# Patient Record
Sex: Female | Born: 1980 | Race: Black or African American | Hispanic: No | Marital: Married | Smoking: Never smoker
Health system: Southern US, Community
[De-identification: ages and names within clinical notes are randomized; demographics above are authoritative.]

## PROBLEM LIST (undated history)

## (undated) HISTORY — PX: BREAST EXCISIONAL BIOPSY: SUR124

---

## 2017-01-09 ENCOUNTER — Emergency Department (HOSPITAL_COMMUNITY)
Admission: EM | Admit: 2017-01-09 | Discharge: 2017-01-09 | Disposition: A | Discharge status: Home / Self Care | Payer: Self-pay | Attending: Emergency Medicine | Admitting: Emergency Medicine

## 2017-01-09 ENCOUNTER — Encounter (HOSPITAL_COMMUNITY): Payer: Self-pay

## 2017-01-09 DIAGNOSIS — H1133 Conjunctival hemorrhage, bilateral: Secondary | ICD-10-CM | POA: Insufficient documentation

## 2017-01-09 NOTE — ED Notes (Signed)
Patient was alert, oriented and stable upon discharge. RN went over AVS and patient had no further questions.  

## 2017-01-09 NOTE — ED Triage Notes (Signed)
Pt states that she vomited 2 days ago. No vomiting or trouble since. Since then her L eye has been red. It appears that a blood vessel has popped. She denies any vision changes or difficulty, but states that her eye is sore. A&Ox4. No N/V/D.

## 2017-01-09 NOTE — ED Provider Notes (Signed)
[facility]-EMERGENCY DEPT Provider Note   CSN: [id number] Arrival date & time: 01/09/17  1808  By signing my name below, I, Alyssa Grove, attest that this documentation has been prepared under the direction and in the presence of [address], [place]. Electronically Signed: Alyssa Grove, ED Scribe. 01/09/17. 8:09 PM.  History   Chief Complaint Chief Complaint  Patient presents with  . Eye Problem   The history is provided by the patient. No language interpreter was used.  Eye Problem   This is a new problem. The current episode started more than 2 days ago. The problem occurs constantly. The problem has not changed since onset.There is a problem in both eyes. There was no injury mechanism. The pain is at a severity of 8/10. The pain is moderate. There is no history of trauma to the eye. There is no known exposure to pink eye. She does not wear contacts. Associated symptoms include eye redness. Pertinent negatives include no numbness, no blurred vision, no decreased vision, no discharge, no double vision, no photophobia, no nausea, no vomiting, no tingling, no weakness and no itching. She has tried nothing for the symptoms. The treatment provided no relief.   HPI Comments: Sarah Sanchez is a 36 y.o. female who presents to the Emergency Department complaining of bilateral eye pain for 4 days. She states she began vomiting after eating out 5 days ago. She awoke the next morning with eye redness and pain. Pt describes eye pain as 8/10, constant, unchanged, non-radiating, L eye soreness that is exacerbated with extended computer usage, improved with using sunglasses. States that the R eye doesn't hurt. She reports associated eye redness. No other treatments tried. She is unaware of any recent contact with similar symptoms. Pt does not wear contact lenses. No ongoing vomiting since she ate out at the restaurant 5 days ago and vomited once. Denies blurry vision, visual deficits, eye drainage, foreign bodies into  eye, eye itchiness, fevers, chills, CP, SOB, abd pain, N/V/D/C, hematuria, dysuria, myalgias, arthralgias, numbness, tingling, focal weakness, or any other complaints at this time.   History reviewed. No pertinent past medical history.  There are no active problems to display for this patient.   No past surgical history on file.  OB History    No data available       Home Medications    Prior to Admission medications   Not on File    Family History History reviewed. No pertinent family history.  Social History Social History  Substance Use Topics  . Smoking status: Not on file  . Smokeless tobacco: Not on file  . Alcohol use Not on file     Allergies   Patient has no known allergies.   Review of Systems Review of Systems  Constitutional: Negative for chills and fever.  Eyes: Positive for pain and redness. Negative for blurred vision, double vision, photophobia, discharge, itching and visual disturbance.  Respiratory: Negative for shortness of breath.   Cardiovascular: Negative for chest pain.  Gastrointestinal: Negative for abdominal pain, constipation, diarrhea, nausea and vomiting.  Genitourinary: Negative for dysuria and hematuria.  Musculoskeletal: Negative for arthralgias and myalgias.  Skin: Negative for color change and itching.  Allergic/Immunologic: Negative for immunocompromised state.  Neurological: Negative for tingling, weakness and numbness.  Psychiatric/Behavioral: Negative for confusion.  10 Systems reviewed and are negative for acute change except as noted in the HPI.   Physical Exam Updated Vital Signs BP (!) 152/131 (BP Location: Left Arm)   Pulse 78  Temp 98.4 F (36.9 C) (Oral)   Resp 12   Ht 5\' 7"  (1.702 m)   Wt 207 lb 4 oz (94 kg)   SpO2 100%   BMI 32.46 kg/m   Physical Exam  Constitutional: She is oriented to person, place, and time. Vital signs are normal. She appears well-developed and well-nourished.  Non-toxic appearance.  No distress.  Afebrile, nontoxic, NAD  HENT:  Head: Normocephalic and atraumatic.  Mouth/Throat: Mucous membranes are normal.  Eyes: EOM are normal. Pupils are equal, round, and reactive to light. Right eye exhibits no discharge. Left eye exhibits no discharge. Right conjunctiva has a hemorrhage. Left conjunctiva has a hemorrhage.  PERRL, EOMI, no nystagmus, no visual field deficits Left eye with small subconjunctival hemorrhage to the medial aspect, right eye with a trace punctate subconjunctival hemorrhage to the lateral aspect, no ocular drainage or FB's noted  Neck: Normal range of motion. Neck supple.  Cardiovascular: Normal rate and intact distal pulses.   Pulmonary/Chest: Effort normal. No respiratory distress.  Abdominal: Normal appearance. She exhibits no distension.  Musculoskeletal: Normal range of motion.  Neurological: She is alert and oriented to person, place, and time. She has normal strength. No sensory deficit.  Skin: Skin is warm, dry and intact. No rash noted.  Psychiatric: She has a normal mood and affect. Her behavior is normal.  Nursing note and vitals reviewed.  ED Treatments / Results  DIAGNOSTIC STUDIES: Oxygen Saturation is 100% on RA, normal by my interpretation.    Labs (all labs ordered are listed, but only abnormal results are displayed) Labs Reviewed - No data to display  EKG  EKG Interpretation None       Radiology No results found.  Procedures Procedures (including critical care time)  Medications Ordered in ED Medications - No data to display   Initial Impression / Assessment and Plan / ED Course  I have reviewed the triage vital signs and the nursing notes.  Pertinent labs & imaging results that were available during my care of the patient were reviewed by me and considered in my medical decision making (see chart for details).     36 y.o. female here with eye soreness after she vomited once and caused a small subconjunctival  hemorrhage to her L eye, and a trace punctate subconjunctival hemorrhage of the right eye. No visual changes, PERRL, EOMI, no drainage, no FBs. Doubt glaucoma, iritis, abrasion, or other concerning emergent etiology. Discussed that this should reabsorb on its own over the next several weeks. Advised pt to use tylenol/motrin/warm or cool compresses, and f/up with ophthalmology in 3-4 days for recheck. I explained the diagnosis and have given explicit precautions to return to the ER including for any other new or worsening symptoms. The patient understands and accepts the medical plan as it's been dictated and I have answered their questions. Discharge instructions concerning home care and prescriptions have been given. The patient is STABLE and is discharged to home in good condition.   I personally performed the services described in this documentation, which was scribed in my presence. The recorded information has been reviewed and is accurate.   Final Clinical Impressions(s) / ED Diagnoses   Final diagnoses:  Subconjunctival hemorrhage of both eyes    New Prescriptions New Prescriptions   No medications on file     [address]Torria Fromer, PA-C 01/09/17 2016    Nira ConnPedro Eduardo Cardama, MD 01/10/17 [phone]

## 2017-01-09 NOTE — Discharge Instructions (Signed)
You have a subconjunctival hemorrhage, which is like a bruise in your eye. The blood will reabsorb on its own over the next several weeks. May consider using tylenol or motrin as needed for pain, and a warm or cool compress to your eyes to help with pain. Follow up with the ophthalmologist in the next 3-4 days for recheck of symptoms and ongoing management of your symptoms. Return to the ER for emergent changes or worsening symptoms.

## 2017-12-23 ENCOUNTER — Encounter (HOSPITAL_COMMUNITY): Payer: Self-pay | Admitting: *Deleted

## 2017-12-23 ENCOUNTER — Emergency Department (HOSPITAL_COMMUNITY)
Admission: EM | Admit: 2017-12-23 | Discharge: 2017-12-23 | Disposition: A | Discharge status: Home / Self Care | Payer: Self-pay | Attending: Emergency Medicine | Admitting: Emergency Medicine

## 2017-12-23 ENCOUNTER — Other Ambulatory Visit: Payer: Self-pay

## 2017-12-23 DIAGNOSIS — M25561 Pain in right knee: Secondary | ICD-10-CM

## 2017-12-23 DIAGNOSIS — G8929 Other chronic pain: Secondary | ICD-10-CM | POA: Insufficient documentation

## 2017-12-23 DIAGNOSIS — M25562 Pain in left knee: Secondary | ICD-10-CM | POA: Insufficient documentation

## 2017-12-23 DIAGNOSIS — Z3202 Encounter for pregnancy test, result negative: Secondary | ICD-10-CM | POA: Insufficient documentation

## 2017-12-23 LAB — POC URINE PREG, ED: Preg Test, Ur: NEGATIVE

## 2017-12-23 NOTE — ED Notes (Signed)
Declined W/C at D/C and was escorted to lobby by RN. 

## 2017-12-23 NOTE — ED Provider Notes (Signed)
[facility]ENC[facility] Provider Note   CSN: [id number] Arrival date & time: 12/23/17  1156     History   Chief Complaint Chief Complaint  Patient presents with  . Knee Pain  . Possible Pregnancy    HPI Sarah Sanchez is a 37 y.o. female.  The history is provided by the patient and medical records. No language interpreter was used.   Sarah Sanchez is an otherwise healthy 37 year old female who presents to emergency department for bilateral knee pain x 2 years.  Pain is intermittent and often occurs after working on her feet all day.  She has been taking Tylenol or Motrin as needed which is been helpful.  Denies any associated swelling, numbness, tingling or weakness.  Never sought care for this in the past.  Patient states that her knees are bothering her yesterday, therefore she called out of work.  Her employer told her today that she would need work note stating that she could return, therefore she came to the emergency department.  Patient additionally got Nexplanon birth control removed last week and concerned for possible pregnancy. She did not know how long after removal of this that she needed to use protection. She is having no abdominal pain, nausea, vomiting, dysuria, vaginal discharge or bleeding.   History reviewed. No pertinent past medical history.  There are no active problems to display for this patient.   History reviewed. No pertinent surgical history.  OB History    No data available       Home Medications    Prior to Admission medications   Not on File    Family History No family history on file.  Social History Social History   Tobacco Use  . Smoking status: Not on file  Substance Use Topics  . Alcohol use: Not on file  . Drug use: Not on file     Allergies   Patient has no known allergies.   Review of Systems Review of Systems  Gastrointestinal: Negative for abdominal pain, nausea and vomiting.    Genitourinary: Negative for difficulty urinating, dysuria, frequency, urgency, vaginal bleeding and vaginal discharge.  Musculoskeletal: Positive for arthralgias. Negative for joint swelling.  Neurological: Negative for weakness and numbness.     Physical Exam Updated Vital Signs BP 132/79 (BP Location: Right Arm)   Pulse 74   Temp 98.6 F (37 C) (Oral)   Resp 16   LMP 11/12/2017 (Approximate)   SpO2 97%   Physical Exam  Constitutional: She appears well-developed and well-nourished. No distress.  HENT:  Head: Normocephalic and atraumatic.  Neck: Neck supple.  Cardiovascular: Normal rate, regular rhythm and normal heart sounds.  No murmur heard. Pulmonary/Chest: Effort normal and breath sounds normal. No respiratory distress. She has no wheezes. She has no rales.  Musculoskeletal: Normal range of motion.  Bilateral knees with full range of motion.  5/5 muscle strength to lower extremities.  Ligaments intact.  No tenderness.  2+ DP.  Sensation intact.  Neurological: She is alert.  Skin: Skin is warm and dry.  Nursing note and vitals reviewed.    ED Treatments / Results  Labs (all labs ordered are listed, but only abnormal results are displayed) Labs Reviewed  POC URINE PREG, ED    EKG  EKG Interpretation None       Radiology No results found.  Procedures Procedures (including critical care time)  Medications Ordered in ED Medications - No data to display   Initial Impression / Assessment and  Plan / ED Course  I have reviewed the triage vital signs and the nursing notes.  Pertinent labs & imaging results that were available during my care of the patient were reviewed by me and considered in my medical decision making (see chart for details).    [name] is a 37 y.o. female who presents to ED for bilateral knee pain for 2 years.  Neurovascularly intact on exam.  Exam quite benign with no tenderness, full strength and ligaments intact.  Patient  ambulatory in ED without any difficulty.  She states that she missed work because of her knee pain yesterday and needs a note to return.  Offered x-rays for further evaluation of knee pain, but she states that she really just needs a note to go back to work.  I feel this is appropriate.  We will have her follow-up with PCP or Ortho as an outpatient as needed.  She also is requesting pregnancy test while in ED.  Pregnancy test negative.  Evaluation does not show pathology that would require ongoing emergent intervention or inpatient treatment. All questions answered.   Final Clinical Impressions(s) / ED Diagnoses   Final diagnoses:  Chronic pain of both knees  Negative pregnancy test    ED Discharge Orders    None       Mieshia Pepitone, Chase Picket, PA-C 12/23/17 1332    Eber Hong, MD 12/24/17 [phone]

## 2017-12-23 NOTE — Discharge Instructions (Signed)
It was my pleasure taking care of you today!   Tylenol or ibuprofen as needed for pain. Ice can also be helpful to aide in pain relief.   Please call either the primary care clinic or the orthopedic doctor listed to schedule a follow up appointment.   Return to the ER for new or worsening symptoms, any additional concerns.

## 2017-12-23 NOTE — ED Triage Notes (Signed)
PT needs work note for work. And has Bil. Knee pain

## 2017-12-23 NOTE — ED Triage Notes (Signed)
To ED for eval of bilateral knee pain and lower back pain. States this pain has been for months. States she called out of work Thursday and told she can't come back until she has a note. Ambulatory without difficulty. Also requests pregnancy test

## 2018-02-25 ENCOUNTER — Emergency Department (HOSPITAL_COMMUNITY)
Admission: EM | Admit: 2018-02-25 | Discharge: 2018-02-26 | Disposition: A | Discharge status: Home / Self Care | Payer: Self-pay | Attending: Emergency Medicine | Admitting: Emergency Medicine

## 2018-02-25 ENCOUNTER — Encounter (HOSPITAL_COMMUNITY): Payer: Self-pay

## 2018-02-25 ENCOUNTER — Emergency Department (HOSPITAL_COMMUNITY): Payer: Self-pay

## 2018-02-25 ENCOUNTER — Other Ambulatory Visit: Payer: Self-pay

## 2018-02-25 DIAGNOSIS — G8929 Other chronic pain: Secondary | ICD-10-CM

## 2018-02-25 DIAGNOSIS — M25562 Pain in left knee: Secondary | ICD-10-CM | POA: Insufficient documentation

## 2018-02-25 DIAGNOSIS — M25561 Pain in right knee: Secondary | ICD-10-CM | POA: Insufficient documentation

## 2018-02-25 NOTE — ED Provider Notes (Signed)
[facility]   CSN: [id number] Arrival date & time: 02/25/18  2039     History   Chief Complaint Chief Complaint  Patient presents with  . Knee Pain    bilateral    HPI Sarah Beechsha Burgener is a 37 y.o. female.   37 year old female presents to the emergency department for evaluation of bilateral knee pain.  She reports onset of knee pain shortly after starting her job at [employer].  She states that she is on her feet frequently and her knee hurts more when she is standing for prolonged periods.  She has taken ibuprofen for symptoms with mild, temporary relief.  She denies any history of trauma or injury.  She feels as though the area is swollen at times.  Her right knee often hurts more frequently than her left.  No associated numbness or paresthesias.  No medications taken prior to arrival today.    History reviewed. No pertinent past medical history.  There are no active problems to display for this patient.   History reviewed. No pertinent surgical history.   OB History   None      Home Medications    Prior to Admission medications   Medication Sig Start Date End Date Taking? Authorizing Provider  diclofenac (VOLTAREN) 75 MG EC tablet Take 1 tablet (75 mg total) by mouth 2 (two) times daily. 02/26/18   Antony MaduraHumes, Jaimes Eckert, PA-C    Family History No family history on file.  Social History Social History   Tobacco Use  . Smoking status: Never Smoker  . Smokeless tobacco: Never Used  Substance Use Topics  . Alcohol use: Never    Frequency: Never  . Drug use: Never     Allergies   Patient has no known allergies.   Review of Systems Review of Systems Ten systems reviewed and are negative for acute change, except as noted in the HPI.    Physical Exam Updated Vital Signs BP 125/74 (BP Location: Right Arm)   Pulse 72   Temp 98.4 F (36.9 C) (Oral)   Resp 16   Ht 5\' 7"  (1.702 m)   Wt 86.2 kg (190 lb)   LMP  02/12/2018 (Approximate)   SpO2 100%   BMI 29.76 kg/m   Physical Exam  Constitutional: She is oriented to person, place, and time. She appears well-developed and well-nourished. No distress.  Nontoxic appearing and in no distress  HENT:  Head: Normocephalic and atraumatic.  Eyes: Conjunctivae and EOM are normal. No scleral icterus.  Neck: Normal range of motion.  Cardiovascular: Normal rate, regular rhythm and intact distal pulses.  DP pulse 2+ bilaterally  Pulmonary/Chest: Effort normal. No stridor. No respiratory distress.  Respirations even and unlabored  Musculoskeletal: Normal range of motion. She exhibits tenderness.       Right knee: She exhibits normal range of motion, no swelling, no deformity, no erythema, no LCL laxity and no MCL laxity. Tenderness found.       Left knee: Normal.  Mild diffuse TTP to the right knee.  Neurological: She is alert and oriented to person, place, and time. She exhibits normal muscle tone. Coordination normal.  Skin: Skin is warm and dry. No rash noted. She is not diaphoretic. No erythema. No pallor.  Psychiatric: She has a normal mood and affect. Her behavior is normal.  Nursing note and vitals reviewed.    ED Treatments / Results  Labs (all labs ordered are listed, but only abnormal results are  displayed) Labs Reviewed - No data to display  EKG None  Radiology Dg Knee Complete 4 Views Right  Result Date: 02/25/2018 CLINICAL DATA:  Anterior right knee pain for several months. EXAM: RIGHT KNEE - COMPLETE 4+ VIEW COMPARISON:  None. FINDINGS: No evidence of fracture, dislocation, or joint effusion. No evidence of arthropathy or other focal bone abnormality. Soft tissues are unremarkable. IMPRESSION: Negative. Electronically Signed   By: Sherian Rein M.D.   On: 02/25/2018 23:55    Procedures Procedures (including critical care time)  Medications Ordered in ED Medications - No data to display   11:53 PM Patient expresses desire for  x-rays.  Will order x-ray of the right knee.  Have discussed low suspicion for bony deformity or fracture, especially in light of chronicity with no history of trauma.  Patient verbalizes understanding.  As her symptoms have been worse with prolonged periods of standing, especially while at her job, have recommended the use of orthotics.  Plan for referral to sports medicine at discharge.   Initial Impression / Assessment and Plan / ED Course  I have reviewed the triage vital signs and the nursing notes.  Pertinent labs & imaging results that were available during my care of the patient were reviewed by me and considered in my medical decision making (see chart for details).     Patient presents to the emergency department for evaluation of bilateral knee pain R>L. Seen in February for same. Patient neurovascularly intact on exam. Imaging negative for fracture, dislocation, bony deformity. Plan for supportive management including RICE and NSAIDs; primary care follow up as needed. Return precautions discussed and provided. Patient discharged in stable condition with no unaddressed concerns.   Final Clinical Impressions(s) / ED Diagnoses   Final diagnoses:  Chronic pain of right knee    ED Discharge Orders        Ordered    diclofenac (VOLTAREN) 75 MG EC tablet  2 times daily     02/26/18 0007       Antony Madura, PA-C 02/26/18 0009    Nira Conn, MD 02/26/18 1351

## 2018-02-25 NOTE — ED Triage Notes (Addendum)
Patient c/o bilateral knee pain that has been worsening over time (2 months). Patient is tearful; had not had xray of knees. Denies injury.

## 2018-02-26 MED ORDER — DICLOFENAC SODIUM 75 MG PO TBEC
75.0000 mg | DELAYED_RELEASE_TABLET | Freq: Two times a day (BID) | ORAL | 0 refills | Status: DC
Start: 2018-02-26 — End: 2022-09-15

## 2018-02-26 NOTE — Discharge Instructions (Addendum)
We recommend the use of orthotics for more equal distribution of your weight, especially while you are working given the you are on your feet for long periods of time.  We have prescribed diclofenac for you to take twice a day as needed for pain.  Ice to areas of pain to limit swelling.  You may use a knee sleeve for stability if desired.

## 2020-09-16 ENCOUNTER — Emergency Department (HOSPITAL_COMMUNITY)
Admission: EM | Admit: 2020-09-16 | Discharge: 2020-09-16 | Disposition: A | Discharge status: Home / Self Care | Payer: Medicaid Other | Attending: Emergency Medicine | Admitting: Emergency Medicine

## 2020-09-16 ENCOUNTER — Emergency Department (HOSPITAL_COMMUNITY): Payer: Medicaid Other

## 2020-09-16 ENCOUNTER — Encounter (HOSPITAL_COMMUNITY): Payer: Self-pay

## 2020-09-16 ENCOUNTER — Other Ambulatory Visit: Payer: Self-pay

## 2020-09-16 DIAGNOSIS — S8001XA Contusion of right knee, initial encounter: Secondary | ICD-10-CM | POA: Diagnosis not present

## 2020-09-16 DIAGNOSIS — W01198A Fall on same level from slipping, tripping and stumbling with subsequent striking against other object, initial encounter: Secondary | ICD-10-CM | POA: Insufficient documentation

## 2020-09-16 DIAGNOSIS — M25561 Pain in right knee: Secondary | ICD-10-CM | POA: Diagnosis present

## 2020-09-16 NOTE — ED Triage Notes (Signed)
Pt arrived via walk in, c/o right knee pain and feeling unsteady when ambulating on it. States he was mopping last night, slipped and hit knee off kitchen table. Pain worsening this morning. Ambulatory but painful. No visible deformity.

## 2020-09-16 NOTE — ED Provider Notes (Signed)
[facility]-EMERGENCY DEPT Provider Note   CSN: [id number] Arrival date & time: 09/16/20  1008     History Chief Complaint  Patient presents with  . Knee Pain    Sarah Sanchez is a 39 y.o. female.  HPI Patient presents with right knee pain.  States she was mopping last night slipped and twisted her right knee and hit it on the table.  Some pain at the time but now worse.  Difficulty walking on this.  States it feels like it could give out now.  Pain is more on the front of the knee.  No other injury.  No numbness or weakness.  Has had mild aches of the knee at times but otherwise has not been an issue for her.    History reviewed. No pertinent past medical history.  There are no problems to display for this patient.   History reviewed. No pertinent surgical history.   OB History   No obstetric history on file.     History reviewed. No pertinent family history.  Social History   Tobacco Use  . Smoking status: Never Smoker  . Smokeless tobacco: Never Used  Substance Use Topics  . Alcohol use: Never  . Drug use: Never    Home Medications Prior to Admission medications   Medication Sig Start Date End Date Taking? Authorizing Provider  diclofenac (VOLTAREN) 75 MG EC tablet Take 1 tablet (75 mg total) by mouth 2 (two) times daily. 02/26/18   Antony Madura, PA-C    Allergies    Patient has no known allergies.  Review of Systems   Review of Systems  Musculoskeletal:       Right knee pain and injury.  Neurological: Negative for weakness and numbness.  Psychiatric/Behavioral: Negative for confusion.    Physical Exam Updated Vital Signs BP 128/87 (BP Location: Right Arm)   Pulse 73   Temp 98.3 F (36.8 C) (Oral)   Resp 16   Ht 5\' 7"  (1.702 m)   Wt 91.2 kg   LMP 09/14/2020   SpO2 100%   BMI 31.48 kg/m   Physical Exam Vitals and nursing note reviewed.  Constitutional:      Appearance: Normal appearance.  Musculoskeletal:      Comments: Tenderness to right knee anteriorly and medially.  Some pain with valgus strain.  No pain with varus strain.  Some tenderness anteriorly.  May have small effusion but knee grossly stable.  No tenderness over ankle or hip.  Skin intact.  Skin:    Capillary Refill: Capillary refill takes less than 2 seconds.  Neurological:     Mental Status: She is alert.     ED Results / Procedures / Treatments   Labs (all labs ordered are listed, but only abnormal results are displayed) Labs Reviewed - No data to display  EKG None  Radiology DG Knee Complete 4 Views Right  Result Date: 09/16/2020 CLINICAL DATA:  Knee pain. Slipped on wet floor at home hitting right knee on table. EXAM: RIGHT KNEE - COMPLETE 4+ VIEW COMPARISON:  None. FINDINGS: No evidence of fracture, dislocation, or joint effusion. No evidence of arthropathy or other focal bone abnormality. Soft tissues are unremarkable. IMPRESSION: Negative. Electronically Signed   By: 09/18/2020 M.D.   On: 09/16/2020 10:43    Procedures Procedures (including critical care time)  Medications Ordered in ED Medications - No data to display  ED Course  I have reviewed the triage vital signs and the nursing notes.  Pertinent labs & imaging results that were available during my care of the patient were reviewed by me and considered in my medical decision making (see chart for details).    MDM Rules/Calculators/A&P                          Patient with right knee pain after twisting it and hitting it yesterday.  Rather benign exam overall.  Knee stable.  No effusion.  X-ray reassuring.  Discussed with patient knee immobilizer versus not.  This point we will hold off on it.  X-ray reviewed by me.  Follow-up as an outpatient with Ortho as needed. Final Clinical Impression(s) / ED Diagnoses Final diagnoses:  Contusion of right knee, initial encounter    Rx / DC Orders ED Discharge Orders    None       Benjiman Core,  MD 09/16/20 1132

## 2020-09-16 NOTE — ED Notes (Signed)
An After Visit Summary was printed and given to the patient. Discharge instructions given and no further questions at this time.  

## 2020-12-14 ENCOUNTER — Emergency Department (HOSPITAL_COMMUNITY)
Admission: EM | Admit: 2020-12-14 | Discharge: 2020-12-14 | Disposition: A | Discharge status: Home / Self Care | Payer: Medicaid Other | Attending: Emergency Medicine | Admitting: Emergency Medicine

## 2020-12-14 ENCOUNTER — Encounter (HOSPITAL_COMMUNITY): Payer: Self-pay

## 2020-12-14 ENCOUNTER — Emergency Department (HOSPITAL_COMMUNITY): Payer: Medicaid Other

## 2020-12-14 ENCOUNTER — Other Ambulatory Visit: Payer: Self-pay

## 2020-12-14 DIAGNOSIS — U071 COVID-19: Secondary | ICD-10-CM | POA: Insufficient documentation

## 2020-12-14 DIAGNOSIS — Z20822 Contact with and (suspected) exposure to covid-19: Secondary | ICD-10-CM

## 2020-12-14 DIAGNOSIS — R519 Headache, unspecified: Secondary | ICD-10-CM | POA: Diagnosis present

## 2020-12-14 LAB — BASIC METABOLIC PANEL
Anion gap: 12 (ref 5–15)
BUN: 10 mg/dL (ref 6–20)
CO2: 22 mmol/L (ref 22–32)
Calcium: 9.1 mg/dL (ref 8.9–10.3)
Chloride: 103 mmol/L (ref 98–111)
Creatinine, Ser: 1.05 mg/dL — ABNORMAL HIGH (ref 0.44–1.00)
GFR, Estimated: >60 mL/min (ref >60)
Glucose, Bld: 99 mg/dL (ref 70–99)
Potassium: 3.3 mmol/L — ABNORMAL LOW (ref 3.5–5.1)
Sodium: 137 mmol/L (ref 135–145)

## 2020-12-14 LAB — TROPONIN I (HIGH SENSITIVITY): Troponin I (High Sensitivity): 5 ng/L (ref <18)

## 2020-12-14 LAB — CBC
HCT: 42.6 % (ref 36.0–46.0)
Hemoglobin: 14.1 g/dL (ref 12.0–15.0)
MCH: 30.1 pg (ref 26.0–34.0)
MCHC: 33.1 g/dL (ref 30.0–36.0)
MCV: 91 fL (ref 80.0–100.0)
Platelets: 234 10*3/uL (ref 150–400)
RBC: 4.68 MIL/uL (ref 3.87–5.11)
RDW: 12.8 % (ref 11.5–15.5)
WBC: 3.7 10*3/uL — ABNORMAL LOW (ref 4.0–10.5)
nRBC: 0 % (ref 0.0–0.2)

## 2020-12-14 LAB — I-STAT BETA HCG BLOOD, ED (NOT ORDERABLE): I-stat hCG, quantitative: <5 m[IU]/mL (ref <5)

## 2020-12-14 LAB — SARS CORONAVIRUS 2 (TAT 6-24 HRS): SARS Coronavirus 2: POSITIVE — AB

## 2020-12-14 MED ORDER — ONDANSETRON 4 MG PO TBDP: 4.0000 mg | ORAL_TABLET | Freq: Three times a day (TID) | ORAL | 0 refills | Status: AC | PRN

## 2020-12-14 MED ORDER — BENZONATATE 100 MG PO CAPS: 100.0000 mg | ORAL_CAPSULE | Freq: Three times a day (TID) | ORAL | 0 refills | Status: DC

## 2020-12-14 NOTE — ED Triage Notes (Signed)
Patient c/o chest pain, generalized body aches, fever, and chills since yesterday.

## 2020-12-14 NOTE — ED Provider Notes (Signed)
[facility]-EMERGENCY DEPT Provider Note   CSN: [id number] Arrival date & time: 12/14/20  1245    History Chief Complaint  Patient presents with  . Chest Pain  . Headache  . Generalized Body Aches  . Chills    Sarah Sanchez is a 40 y.o. female with past medical history who presents for evaluation of possible Covid. Patient states she has had myalgias, chills, congestion, rhinorrhea, cough, headache which began 2 days ago. She is unvaccinated for Covid. Unknown exposures however does work in the [employer]. She generally wears a mask. States she does intermittently have chest pain when she coughs. She denies any exertional pleuritic chest pain. No hemoptysis. No unilateral leg swelling, redness, warmth, history of PE, DVT. Denies denies thunderclap headache, paresthesias, weakness, neck pain, neck stiffness, abdominal pain, unilateral leg swelling, redness, warmth, dysuria, weakness.  Denies additional aggravating or alleviating factors.  States she did take DayQuil yesterday however did not help so she has not taken it.  History obtained from patient and past medical records.  No interpreter used.    HPI     History reviewed. No pertinent past medical history.  There are no problems to display for this patient.   History reviewed. No pertinent surgical history.   OB History   No obstetric history on file.     Family History  Problem Relation Age of Onset  . Hypertension Mother     Social History   Tobacco Use  . Smoking status: Never Smoker  . Smokeless tobacco: Never Used  Vaping Use  . Vaping Use: Never used  Substance Use Topics  . Alcohol use: Never  . Drug use: Never    Home Medications Prior to Admission medications   Medication Sig Start Date End Date Taking? Authorizing Provider  benzonatate (TESSALON) 100 MG capsule Take 1 capsule (100 mg total) by mouth every 8 (eight) hours. 12/14/20  Yes Derak Schurman A, PA-C  ondansetron  (ZOFRAN ODT) 4 MG disintegrating tablet Take 1 tablet (4 mg total) by mouth every 8 (eight) hours as needed for nausea or vomiting. 12/14/20  Yes Juanda Luba A, PA-C  diclofenac (VOLTAREN) 75 MG EC tablet Take 1 tablet (75 mg total) by mouth 2 (two) times daily. 02/26/18   Antony Madura, PA-C    Allergies    Patient has no known allergies.  Review of Systems   Review of Systems  Constitutional: Positive for activity change, chills and fatigue.  HENT: Positive for congestion and rhinorrhea.   Respiratory: Positive for cough. Negative for apnea, choking, chest tightness, shortness of breath, wheezing and stridor.   Cardiovascular: Positive for chest pain (With coughing).  Gastrointestinal: Negative.   Genitourinary: Negative.   Musculoskeletal: Positive for myalgias.  Skin: Negative.   Neurological: Positive for weakness. Negative for dizziness, tremors (Generalized), seizures, syncope, facial asymmetry, speech difficulty, light-headedness, numbness and headaches.  All other systems reviewed and are negative.   Physical Exam Updated Vital Signs BP 116/75   Pulse 78   Temp 99.1 F (37.3 C) (Oral)   Resp 15   Ht 5\' 7"  (1.702 m)   Wt 93 kg   LMP 12/14/2020   SpO2 100%   BMI 32.11 kg/m   Physical Exam Vitals and nursing note reviewed.  Constitutional:      General: She is not in acute distress.    Appearance: She is not ill-appearing, toxic-appearing or diaphoretic.  HENT:     Head: Normocephalic and atraumatic.  Jaw: There is normal jaw occlusion.     Nose:     Comments: Clear rhinorrhea and congestion to bilateral nares.  No sinus tenderness.    Mouth/Throat:     Comments: Posterior oropharynx clear.  Mucous membranes moist.  Tonsils without erythema or exudate.  Uvula midline without deviation.  No evidence of PTA or RPA.  No drooling, dysphasia or trismus.  Phonation normal. Neck:     Trachea: Trachea and phonation normal.     Meningeal: Brudzinski's sign and  Kernig's sign absent.     Comments: No Neck stiffness or neck rigidity.  No meningismus.  No cervical lymphadenopathy. Cardiovascular:     Comments: No murmurs rubs or gallops. Pulmonary:     Comments: Clear to auscultation bilaterally without wheeze, rhonchi or rales.  No accessory muscle usage.  Able speak in full sentences. Abdominal:     Comments: Soft, nontender without rebound or guarding.  No CVA tenderness.  Musculoskeletal:     Comments: Moves all 4 extremities without difficulty.  Lower extremities without edema, erythema or warmth.  Skin:    Comments: Brisk capillary refill.  No rashes or lesions.  Neurological:     Mental Status: She is alert.     Comments: Ambulatory in department without difficulty.  Cranial nerves II through XII grossly intact.  No facial droop.  No aphasia.     ED Results / Procedures / Treatments   Labs (all labs ordered are listed, but only abnormal results are displayed) Labs Reviewed  BASIC METABOLIC PANEL - Abnormal; Notable for the following components:      Result Value   Potassium 3.3 (*)    Creatinine, Ser 1.05 (*)    All other components within normal limits  CBC - Abnormal; Notable for the following components:   WBC 3.7 (*)    All other components within normal limits  SARS CORONAVIRUS 2 (TAT 6-24 HRS)  I-STAT BETA HCG BLOOD, ED ([facility], [facility], [facility] ONLY)  I-STAT BETA HCG BLOOD, ED (NOT ORDERABLE)  TROPONIN I (HIGH SENSITIVITY)  TROPONIN I (HIGH SENSITIVITY)    EKG None  Radiology DG Chest 2 View  Result Date: 12/14/2020 CLINICAL DATA:  Chest pain and fever EXAM: CHEST - 2 VIEW COMPARISON:  None. FINDINGS: The heart size and mediastinal contours are within normal limits. Both lungs are clear. The visualized skeletal structures are unremarkable. IMPRESSION: No active cardiopulmonary disease. Electronically Signed   By: Marnee Spring M.D.   On: 12/14/2020 11:22    Procedures Procedures   Medications Ordered in ED Medications - No  data to display  ED Course  I have reviewed the triage vital signs and the nursing notes.  Pertinent labs & imaging results that were available during my care of the patient were reviewed by me and considered in my medical decision making (see chart for details).  40 year old presents for evaluation of upper respiratory complaints.  On arrival she is afebrile, nonseptic, non-ill-appearing.  Her heart and lungs are clear.  Her abdomen is soft, nontender.  Her posterior oropharynx is clear.  No evidence of PTA or RPA.  She is tolerating p.o. intake without difficulty she has no neck stiffness or neck rigidity.  She has no meningismus.  I will suspicion for meningitis.  She has no unilateral leg swelling, redness or warmth.  Low clinical suspicion for DVT, PE.  Work-up started from triage which I personally viewed interpreted.  Labs and imaging personally reviewed and interpreted CBC, leukopenia at 3.7 Metabolic  panel at 3.3 creatinine 1.05, no additional electrolyte, renal or liver abnormality Pregnancy test negative Troponin V, low suspicion for ACS, feel given timeline of symptoms would likely be elevated at this time if ACS was likely which I have low suspicion for Chest x-ray without cardiomegaly, pulmonary edema, pneumothorax, infiltrates EKG without ischemic changes  The patient has been appropriately medically screened and/or stabilized in the ED. I have low suspicion for any other emergent medical condition which would require further screening, evaluation or treatment in the ED or require inpatient management.  Patient is hemodynamically stable and in no acute distress.  Patient able to ambulate in department prior to ED.  Evaluation does not show acute pathology that would require ongoing or additional emergent interventions while in the emergency department or further inpatient treatment.  I have discussed the diagnosis with the patient and answered all questions.  Pain is been managed  while in the emergency department and patient has no further complaints prior to discharge.  Patient is comfortable with plan discussed in room and is stable for discharge at this time.  I have discussed strict return precautions for returning to the emergency department.  Patient was encouraged to follow-up with PCP/specialist refer to at discharge.    MDM Rules/Calculators/A&P                          Guilianna Thall was evaluated in Emergency Department on 12/14/2020 for the symptoms described in the history of present illness. She was evaluated in the context of the global COVID-19 pandemic, which necessitated consideration that the patient might be at risk for infection with the SARS-CoV-2 virus that causes COVID-19. Institutional protocols and algorithms that pertain to the evaluation of patients at risk for COVID-19 are in a state of rapid change based on information released by regulatory bodies including the CDC and federal and state organizations. These policies and algorithms were followed during the patient's care in the ED. Final Clinical Impression(s) / ED Diagnoses Final diagnoses:  Person under investigation for COVID-19    Rx / DC Orders ED Discharge Orders         Ordered    benzonatate (TESSALON) 100 MG capsule  Every 8 hours        12/14/20 1315    ondansetron (ZOFRAN ODT) 4 MG disintegrating tablet  Every 8 hours PRN        12/14/20 1315           Katara Griner A, PA-C 12/14/20 1326    Linwood Dibbles, MD 12/15/20 [phone]

## 2020-12-14 NOTE — Discharge Instructions (Signed)
Your labs and imaging were unremarkable  Your Covid test is pending at discharge.  Based off your symptoms a viral infection is likely.  Follow-up on Covid test results on MyChart.  How to send a MyChart account is listed on the back of your discharge paperwork.  You will also be called in 2 to 3 days if your test is positive.  Return for any worsening symptoms.

## 2020-12-15 ENCOUNTER — Telehealth: Payer: Self-pay | Admitting: Physician Assistant

## 2020-12-15 NOTE — Telephone Encounter (Signed)
Called to discuss with patient about COVID-19 symptoms and the use of one of the available treatments for those with mild to moderate Covid symptoms and at a high risk of hospitalization.  Pt appears to qualify for outpatient treatment due to co-morbid conditions and/or a member of an at-risk group in accordance with the FDA Emergency Use Authorization.    Symptom onset: about 3 days ago Vaccinated: no Booster? No Immunocompromised? no Qualifiers: BMI and Tier 2  Unable to reach pt. LVMTCB  [employer]

## 2021-07-05 ENCOUNTER — Other Ambulatory Visit: Payer: Self-pay | Admitting: Internal Medicine

## 2021-07-06 LAB — COMPLETE METABOLIC PANEL WITH GFR
AG Ratio: 1.3 (calc) (ref 1.0–2.5)
ALT: 16 U/L (ref 6–29)
AST: 20 U/L (ref 10–30)
Albumin: 3.9 g/dL (ref 3.6–5.1)
Alkaline phosphatase (APISO): 52 U/L (ref 31–125)
BUN/Creatinine Ratio: 12 (calc) (ref 6–22)
BUN: 14 mg/dL (ref 7–25)
CO2: 24 mmol/L (ref 20–32)
Calcium: 8.9 mg/dL (ref 8.6–10.2)
Chloride: 104 mmol/L (ref 98–110)
Creat: 1.16 mg/dL — ABNORMAL HIGH (ref 0.50–0.97)
Globulin: 2.9 g/dL (calc) (ref 1.9–3.7)
Glucose, Bld: 76 mg/dL (ref 65–99)
Potassium: 3.8 mmol/L (ref 3.5–5.3)
Sodium: 138 mmol/L (ref 135–146)
Total Bilirubin: 0.5 mg/dL (ref 0.2–1.2)
Total Protein: 6.8 g/dL (ref 6.1–8.1)
eGFR: 62 mL/min/{1.73_m2} (ref >=60)

## 2021-07-06 LAB — LIPID PANEL
Cholesterol: 151 mg/dL (ref <200)
HDL: 39 mg/dL — ABNORMAL LOW (ref >=50)
LDL Cholesterol (Calc): 95 mg/dL (calc)
Non-HDL Cholesterol (Calc): 112 mg/dL (calc) (ref <130)
Total CHOL/HDL Ratio: 3.9 (calc) (ref <5.0)
Triglycerides: 84 mg/dL (ref <150)

## 2021-07-06 LAB — CBC
HCT: 40.4 % (ref 35.0–45.0)
Hemoglobin: 13 g/dL (ref 11.7–15.5)
MCH: 29.3 pg (ref 27.0–33.0)
MCHC: 32.2 g/dL (ref 32.0–36.0)
MCV: 91.2 fL (ref 80.0–100.0)
MPV: 9.9 fL (ref 7.5–12.5)
Platelets: 292 10*3/uL (ref 140–400)
RBC: 4.43 10*6/uL (ref 3.80–5.10)
RDW: 12.2 % (ref 11.0–15.0)
WBC: 6.7 10*3/uL (ref 3.8–10.8)

## 2021-07-06 LAB — C. TRACHOMATIS/N. GONORRHOEAE RNA
C. trachomatis RNA, TMA: NOT DETECTED
N. gonorrhoeae RNA, TMA: NOT DETECTED

## 2021-07-06 LAB — VITAMIN D 25 HYDROXY (VIT D DEFICIENCY, FRACTURES): Vit D, 25-Hydroxy: 42 ng/mL (ref 30–100)

## 2021-07-06 LAB — TSH: TSH: 1.3 mIU/L

## 2021-07-06 LAB — RPR: RPR Ser Ql: NONREACTIVE

## 2021-07-06 LAB — HIV ANTIBODY (ROUTINE TESTING W REFLEX): HIV 1&2 Ab, 4th Generation: NONREACTIVE

## 2021-07-08 LAB — PAP IG W/ RFLX HPV ASCU

## 2021-08-04 ENCOUNTER — Emergency Department (HOSPITAL_COMMUNITY)
Admission: EM | Admit: 2021-08-04 | Discharge: 2021-08-04 | Disposition: A | Discharge status: Home / Self Care | Payer: Medicaid Other | Attending: Emergency Medicine | Admitting: Emergency Medicine

## 2021-08-04 ENCOUNTER — Encounter (HOSPITAL_COMMUNITY): Payer: Self-pay

## 2021-08-04 DIAGNOSIS — Y93G3 Activity, cooking and baking: Secondary | ICD-10-CM | POA: Insufficient documentation

## 2021-08-04 DIAGNOSIS — S60415A Abrasion of left ring finger, initial encounter: Secondary | ICD-10-CM | POA: Insufficient documentation

## 2021-08-04 DIAGNOSIS — S61215A Laceration without foreign body of left ring finger without damage to nail, initial encounter: Secondary | ICD-10-CM | POA: Insufficient documentation

## 2021-08-04 DIAGNOSIS — W260XXA Contact with knife, initial encounter: Secondary | ICD-10-CM | POA: Diagnosis not present

## 2021-08-04 DIAGNOSIS — S6992XA Unspecified injury of left wrist, hand and finger(s), initial encounter: Secondary | ICD-10-CM | POA: Diagnosis present

## 2021-08-04 DIAGNOSIS — Z23 Encounter for immunization: Secondary | ICD-10-CM | POA: Insufficient documentation

## 2021-08-04 DIAGNOSIS — T148XXA Other injury of unspecified body region, initial encounter: Secondary | ICD-10-CM

## 2021-08-04 MED ORDER — BACITRACIN ZINC 500 UNIT/GM EX OINT
TOPICAL_OINTMENT | Freq: Two times a day (BID) | CUTANEOUS | Status: DC
  Filled 2021-08-04: qty 0.9

## 2021-08-04 MED ORDER — LIDOCAINE HCL (PF) 1 % IJ SOLN
10.0000 mL | Freq: Once | INTRAMUSCULAR | Status: AC
  Administered 2021-08-04: 10 mL
  Filled 2021-08-04: qty 30

## 2021-08-04 MED ORDER — TETANUS-DIPHTH-ACELL PERTUSSIS 5-2.5-18.5 LF-MCG/0.5 IM SUSY
0.5000 mL | PREFILLED_SYRINGE | Freq: Once | INTRAMUSCULAR | Status: AC
  Administered 2021-08-04: 0.5 mL via INTRAMUSCULAR
  Filled 2021-08-04: qty 0.5

## 2021-08-04 NOTE — ED Triage Notes (Signed)
Pt arrived via POV, laceration to finger, left hand while cutting onions this morning. Last known tetanus unknown.

## 2021-08-04 NOTE — ED Provider Notes (Signed)
[facility]   CSN: [id number] Arrival date & time: 08/04/21  1696     History Chief Complaint  Patient presents with   Laceration    Sarah Sanchez is a 40 y.o. female with no significant past medical history presents after cutting her left fourth finger with a knife this morning while making breakfast.  Patient reports that she was slicing onions with a new knife and the knife slipped cutting the skin on her left ring finger and making the skin on her left thumb.  Patient does not know when her last tetanus shot was.  Patient is not taking a blood thinner, is not diabetic. Patient is not in significant pain, denies numbness distal to the injury.   Laceration     History reviewed. No pertinent past medical history.  There are no problems to display for this patient.   History reviewed. No pertinent surgical history.   OB History   No obstetric history on file.     Family History  Problem Relation Age of Onset   Hypertension Mother     Social History   Tobacco Use   Smoking status: Never   Smokeless tobacco: Never  Vaping Use   Vaping Use: Never used  Substance Use Topics   Alcohol use: Never   Drug use: Never    Home Medications Prior to Admission medications   Medication Sig Start Date End Date Taking? Authorizing Provider  benzonatate (TESSALON) 100 MG capsule Take 1 capsule (100 mg total) by mouth every 8 (eight) hours. 12/14/20   Henderly, Britni A, PA-C  diclofenac (VOLTAREN) 75 MG EC tablet Take 1 tablet (75 mg total) by mouth 2 (two) times daily. 02/26/18   Antony Madura, PA-C  ondansetron (ZOFRAN ODT) 4 MG disintegrating tablet Take 1 tablet (4 mg total) by mouth every 8 (eight) hours as needed for nausea or vomiting. 12/14/20   Henderly, Britni A, PA-C    Allergies    Patient has no known allergies.  Review of Systems   Review of Systems  Skin:  Positive for wound.  All other systems reviewed and are  negative.  Physical Exam Updated Vital Signs BP 130/68 (BP Location: Right Arm)   Pulse 65   Temp 98.9 F (37.2 C) (Oral)   Resp 16   SpO2 99%   Physical Exam Vitals and nursing note reviewed.  Constitutional:      General: She is not in acute distress.    Appearance: Normal appearance.  HENT:     Head: Normocephalic and atraumatic.  Eyes:     General:        Right eye: No discharge.        Left eye: No discharge.  Cardiovascular:     Rate and Rhythm: Normal rate and regular rhythm.     Comments: DP, PT pulses intact and 2+ in the left hand. Pulmonary:     Effort: Pulmonary effort is normal. No respiratory distress.  Musculoskeletal:        General: No deformity.  Skin:    General: Skin is warm and dry.     Capillary Refill: Capillary refill takes less than 2 seconds. Distal to the injury.    Comments: Approximately 2 to 3 cm laceration on the ventral side of the distal phalanx of the left fourth finger.  Additionally there is a small laceration on the dorsal surface overlying the distal metacarpal head of the first digit.  Wound on the fourth  finger is slowly leaking blood.  Neurological:     Mental Status: She is alert and oriented to person, place, and time.  Psychiatric:        Mood and Affect: Mood normal.        Behavior: Behavior normal.    ED Results / Procedures / Treatments   Labs (all labs ordered are listed, but only abnormal results are displayed) Labs Reviewed - No data to display  EKG None  Radiology No results found.  Procedures .Marland KitchenLaceration Repair  Date/Time: 08/04/2021 10:17 AM Performed by: Olene Floss, PA-C Authorized by: Olene Floss, PA-C   Consent:    Consent obtained:  Verbal   Consent given by:  Patient   Risks, benefits, and alternatives were discussed: yes     Risks discussed:  Infection, pain and poor wound healing   Alternatives discussed:  No treatment Universal protocol:    Patient identity confirmed:   Verbally with patient Anesthesia:    Anesthesia method:  Nerve block   Block location:  Flexor tendon sheath left 4th finger   Block needle gauge:  25 G   Block anesthetic:  Lidocaine 1% w/o epi   Block injection procedure:  Anatomic landmarks identified, negative aspiration for blood and introduced needle   Block outcome:  Anesthesia achieved Laceration details:    Location:  Finger   Finger location:  L ring finger   Length (cm):  4.4   Depth (mm):  2 Treatment:    Area cleansed with:  Povidone-iodine   Amount of cleaning:  Standard Skin repair:    Repair method:  Sutures   Suture size:  4-0   Suture material:  Prolene   Suture technique:  Simple interrupted   Number of sutures:  9 Approximation:    Approximation:  Close Repair type:    Repair type:  Simple Post-procedure details:    Dressing:  Non-adherent dressing and antibiotic ointment   Procedure completion:  Tolerated   Medications Ordered in ED Medications  lidocaine (PF) (XYLOCAINE) 1 % injection 10 mL (has no administration in time range)  Tdap (BOOSTRIX) injection 0.5 mL (has no administration in time range)  bacitracin ointment (has no administration in time range)    ED Course  I have reviewed the triage vital signs and the nursing notes.  Pertinent labs & imaging results that were available during my care of the patient were reviewed by me and considered in my medical decision making (see chart for details).    MDM Rules/Calculators/A&P                         Laceration repaired as described above.  Wound is neurovascularly intact.  Recommend patient bandage with antibacterial ointment, follow-up for suture removal in 10 to 14 days.  Recommend the patient keep the wound covered during work for the next several days.  Discussed due to shallow nature of laceration, and the fact that a large flap was created due to the nature of the injury that it is possible that the entire wound may not regain blood flow,  she may lose some skin.  Discussed signs of infection to watch out for.  Bleeding controlled with ligature.  Wound bandaged with nonadherent dressing, and antibiotic ointment.  Patient discharged in stable condition. Final Clinical Impression(s) / ED Diagnoses Final diagnoses:  Laceration of left ring finger without foreign body without damage to nail, initial encounter  Excoriation    Rx /  DC Orders ED Discharge Orders     None        [place] 08/04/21 1022    Wynetta Fines, MD 08/06/21 [phone]

## 2021-08-04 NOTE — Discharge Instructions (Signed)
He is very return in 10 to 14 days for suture removal.  You may return to your primary care, urgent care, or to the emergency room.  Please watch out for signs of infection including redness, swelling, excessive pain, purulent drainage.  Please keep the wound covered, bandage with antibacterial ointment for the next several days.  Keep covered at work until fully healed.

## 2021-12-03 ENCOUNTER — Other Ambulatory Visit: Payer: Self-pay | Admitting: Internal Medicine

## 2021-12-06 LAB — C. TRACHOMATIS/N. GONORRHOEAE RNA
C. trachomatis RNA, TMA: NOT DETECTED
N. gonorrhoeae RNA, TMA: NOT DETECTED

## 2022-02-07 ENCOUNTER — Encounter: Payer: Medicaid Other | Admitting: Obstetrics and Gynecology

## 2022-05-09 ENCOUNTER — Other Ambulatory Visit: Payer: Self-pay | Admitting: Internal Medicine

## 2022-05-09 DIAGNOSIS — Z1231 Encounter for screening mammogram for malignant neoplasm of breast: Secondary | ICD-10-CM

## 2022-05-11 ENCOUNTER — Ambulatory Visit
Admission: RE | Admit: 2022-05-11 | Discharge: 2022-05-11 | Disposition: A | Discharge status: Home / Self Care | Payer: Medicaid Other | Source: Ambulatory Visit | Attending: Internal Medicine | Admitting: Internal Medicine

## 2022-05-11 DIAGNOSIS — Z1231 Encounter for screening mammogram for malignant neoplasm of breast: Secondary | ICD-10-CM

## 2022-09-15 ENCOUNTER — Emergency Department (HOSPITAL_COMMUNITY)
Admission: EM | Admit: 2022-09-15 | Discharge: 2022-09-15 | Disposition: A | Discharge status: Home / Self Care | Payer: Self-pay | Attending: Emergency Medicine | Admitting: Emergency Medicine

## 2022-09-15 ENCOUNTER — Other Ambulatory Visit: Payer: Self-pay

## 2022-09-15 ENCOUNTER — Encounter (HOSPITAL_COMMUNITY): Payer: Self-pay

## 2022-09-15 ENCOUNTER — Emergency Department (HOSPITAL_COMMUNITY): Payer: Self-pay

## 2022-09-15 DIAGNOSIS — G8929 Other chronic pain: Secondary | ICD-10-CM | POA: Insufficient documentation

## 2022-09-15 DIAGNOSIS — M25562 Pain in left knee: Secondary | ICD-10-CM | POA: Insufficient documentation

## 2022-09-15 DIAGNOSIS — M25561 Pain in right knee: Secondary | ICD-10-CM | POA: Insufficient documentation

## 2022-09-15 MED ORDER — IBUPROFEN 800 MG PO TABS
800.0000 mg | ORAL_TABLET | Freq: Once | ORAL | Status: AC
  Administered 2022-09-15: 800 mg via ORAL
  Filled 2022-09-15: qty 1

## 2022-09-15 MED ORDER — DICLOFENAC SODIUM 75 MG PO TBEC: 75.0000 mg | DELAYED_RELEASE_TABLET | Freq: Two times a day (BID) | ORAL | 0 refills | Status: AC

## 2022-09-15 NOTE — ED Triage Notes (Signed)
Pt c/o bilateral knee pain. Pt states its been going on for months, pt c/o spraining the right knee last year. Pt states she has seen her PCP for bilateral knee pain and has been given steroid shots with very little relief.

## 2022-09-15 NOTE — ED Provider Notes (Signed)
[facility] DEPT Provider Note   CSN: [id number] Arrival date & time: 09/15/22  1858     History  Chief Complaint  Patient presents with   Bilateral Knee Pain    Sarah Sanchez is a 41 y.o. female.  Patient with history of chronic bilateral knee pain presents today with complaints of bilateral knee pain. States that same has been ongoing off and on for greater than 4 years. States that this episode began initially around 2 months ago when she started a new job that required her to be on her feet more. She states that she has been seeing her pcp for same who has been giving her steroid injections, but states that these injections only help her for a day and then the pain returns. She denies any known injuries. States she has been taking tylenol with minimal relief. Denies fevers or chills. She denies ever seeing orthopedics for her symptoms. She is able to ambulate with some discomfort.  The history is provided by the patient. No language interpreter was used.       Home Medications Prior to Admission medications   Medication Sig Start Date End Date Taking? Authorizing Provider  benzonatate (TESSALON) 100 MG capsule Take 1 capsule (100 mg total) by mouth every 8 (eight) hours. 12/14/20   Henderly, Britni A, PA-C  diclofenac (VOLTAREN) 75 MG EC tablet Take 1 tablet (75 mg total) by mouth 2 (two) times daily. 02/26/18   Antonietta Breach, PA-C  ondansetron (ZOFRAN ODT) 4 MG disintegrating tablet Take 1 tablet (4 mg total) by mouth every 8 (eight) hours as needed for nausea or vomiting. 12/14/20   Henderly, Britni A, PA-C      Allergies    Patient has no known allergies.    Review of Systems   Review of Systems  Musculoskeletal:  Positive for arthralgias.  All other systems reviewed and are negative.   Physical Exam Updated Vital Signs BP (!) 151/108   Pulse 75   Temp 98.9 F (37.2 C) (Oral)   Resp 16   Ht 5\' 7"  (1.702 m)   Wt 93 kg   LMP 08/29/2022    SpO2 100%   BMI 32.11 kg/m  Physical Exam Vitals and nursing note reviewed.  Constitutional:      General: She is not in acute distress.    Appearance: Normal appearance. She is normal weight. She is not ill-appearing, toxic-appearing or diaphoretic.  HENT:     Head: Normocephalic and atraumatic.  Cardiovascular:     Rate and Rhythm: Normal rate.  Pulmonary:     Effort: Pulmonary effort is normal. No respiratory distress.  Musculoskeletal:        General: Normal range of motion.     Cervical back: Normal range of motion.     Comments: No appreciable tenderness to palpation of either knees. Trace swelling in the left knee, none in the right. No erythema, warmth, fluctuance, induration, or overlying skin changes. DP and PT pulses intact and 2+. ROM intact with some discomfort. No laxity.  Skin:    General: Skin is warm and dry.  Neurological:     General: No focal deficit present.     Mental Status: She is alert.  Psychiatric:        Mood and Affect: Mood normal.        Behavior: Behavior normal.     ED Results / Procedures / Treatments   Labs (all labs ordered are listed, but only abnormal results  are displayed) Labs Reviewed - No data to display  EKG None  Radiology DG Knee Complete 4 Views Left  Result Date: 09/15/2022 CLINICAL DATA:  Bilateral knee pain for several months. No relief with injections. EXAM: LEFT KNEE - COMPLETE 4+ VIEW; RIGHT KNEE - COMPLETE 4+ VIEW COMPARISON:  None Available. FINDINGS: No evidence of fracture, dislocation, or joint effusion. No evidence of arthropathy or other focal bone abnormality. Soft tissues are unremarkable. IMPRESSION: Negative. Electronically Signed   By: Burman Nieves M.D.   On: 09/15/2022 20:39   DG Knee Complete 4 Views Right  Result Date: 09/15/2022 CLINICAL DATA:  Bilateral knee pain for several months. No relief with injections. EXAM: LEFT KNEE - COMPLETE 4+ VIEW; RIGHT KNEE - COMPLETE 4+ VIEW COMPARISON:  None  Available. FINDINGS: No evidence of fracture, dislocation, or joint effusion. No evidence of arthropathy or other focal bone abnormality. Soft tissues are unremarkable. IMPRESSION: Negative. Electronically Signed   By: Burman Nieves M.D.   On: 09/15/2022 20:39    Procedures Procedures    Medications Ordered in ED Medications  ibuprofen (ADVIL) tablet 800 mg (800 mg Oral Given 09/15/22 2138)    ED Course/ Medical Decision Making/ A&P                           Medical Decision Making Amount and/or Complexity of Data Reviewed Radiology: ordered.   Patient presents today with complaints of 2 months a flare of her chronic bilateral knee pain.  She is afebrile, nontoxic-appearing, and in no acute distress with reassuring vital signs.  Physical exam reveals trace swelling to the left knee but no erythema, warmth, fluctuance, or induration.  No tenderness to palpation or laxity. Right knee without any swelling, erythema, warmth, or skin changes.  Good pulses and soft compartments.  Patient observed to be ambulatory with steady gait.  X-ray imaging obtained of both knees which was reassuring for acute findings.  I personally reviewed and interpreted this imaging and agree with radiology interpretation.  Patient denies any precipitating injury.  Very low suspicion for gout or septic arthritis. Suspect her pain is potentially from overuse vs inflammatory arthritis. Given knee braces for support.  Also given ibuprofen with some relief.  Will give referral to orthopedics for continued evaluation and management.  We will also give prescription for diclofenac for pain relief. RICE discussed and recommended.  Patient is stable for discharge.  Educated on red flag symptoms of prompt immediate return.  Patient understanding and amenable with plan.  Discharged in stable condition.   Final Clinical Impression(s) / ED Diagnoses Final diagnoses:  Bilateral chronic knee pain    Rx / DC Orders ED Discharge  Orders          Ordered    diclofenac (VOLTAREN) 75 MG EC tablet  2 times daily        09/15/22 2145          An After Visit Summary was printed and given to the patient.     Vear Clock 09/15/22 2147    Glynn Octave, MD 09/16/22 [phone]

## 2022-09-15 NOTE — Discharge Instructions (Addendum)
As we discussed, your work-up in the ER today was reassuring for acute findings.  X-ray imaging did not reveal any emergent concerns.  I have given you braces to wear on both of your knees for support of your symptoms and a prescription for an anti-inflammatory medication for you to take as prescribed as needed.  I also recommend that you rest, ice, compress, and elevate your knees for additional symptomatic relief.  You may also take Tylenol as needed.  Additionally, have given you a referral to orthopedics with a number to call to schedule appointment for continued evaluation and management of your pain.  Return if development of any new or worsening symptoms.

## 2023-03-13 IMAGING — MG MM DIGITAL SCREENING BILAT W/ TOMO AND CAD
6 of 10 series · 6 of 30 positions shown · non-contrast
Comparison: None available.

CLINICAL DATA: Screening.

EXAM:
DIGITAL SCREENING BILATERAL MAMMOGRAM WITH TOMOSYNTHESIS AND CAD
TECHNIQUE: Bilateral screening digital craniocaudal and mediolateral oblique
mammograms were obtained. Bilateral screening digital breast
tomosynthesis was performed. The images were evaluated with
computer-aided detection.

[L CC synth-2D (1 of 2)]
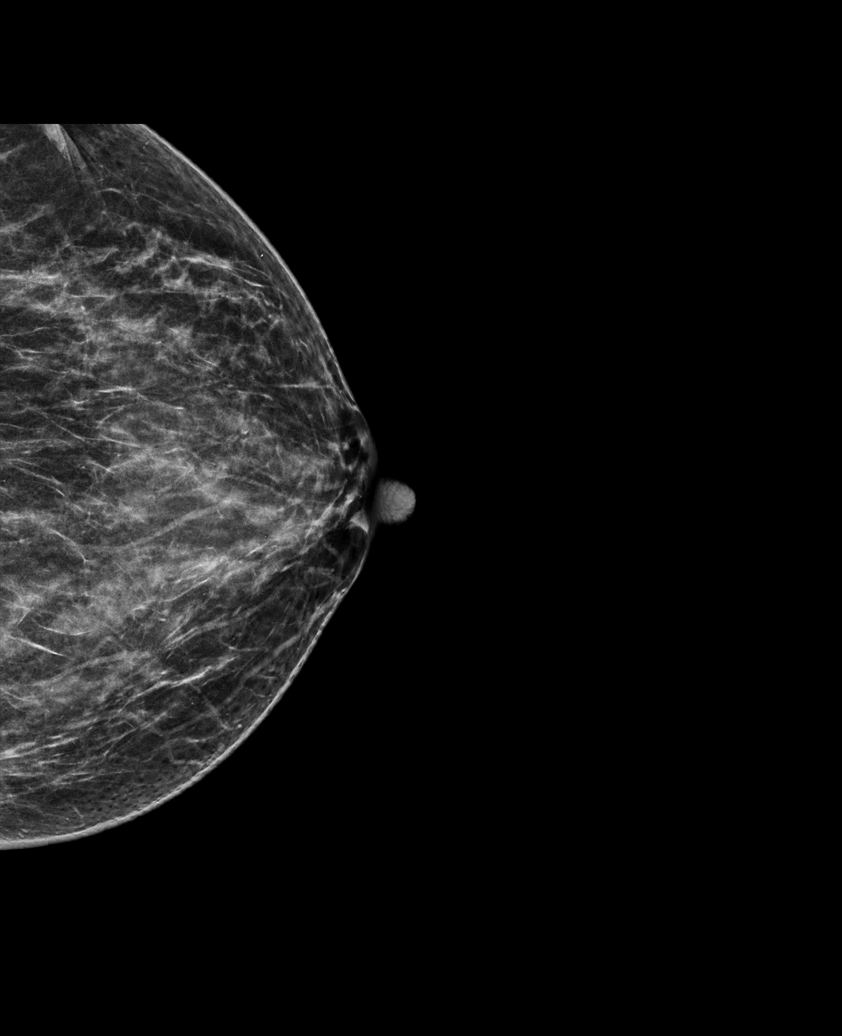

[R MLO synth-2D]
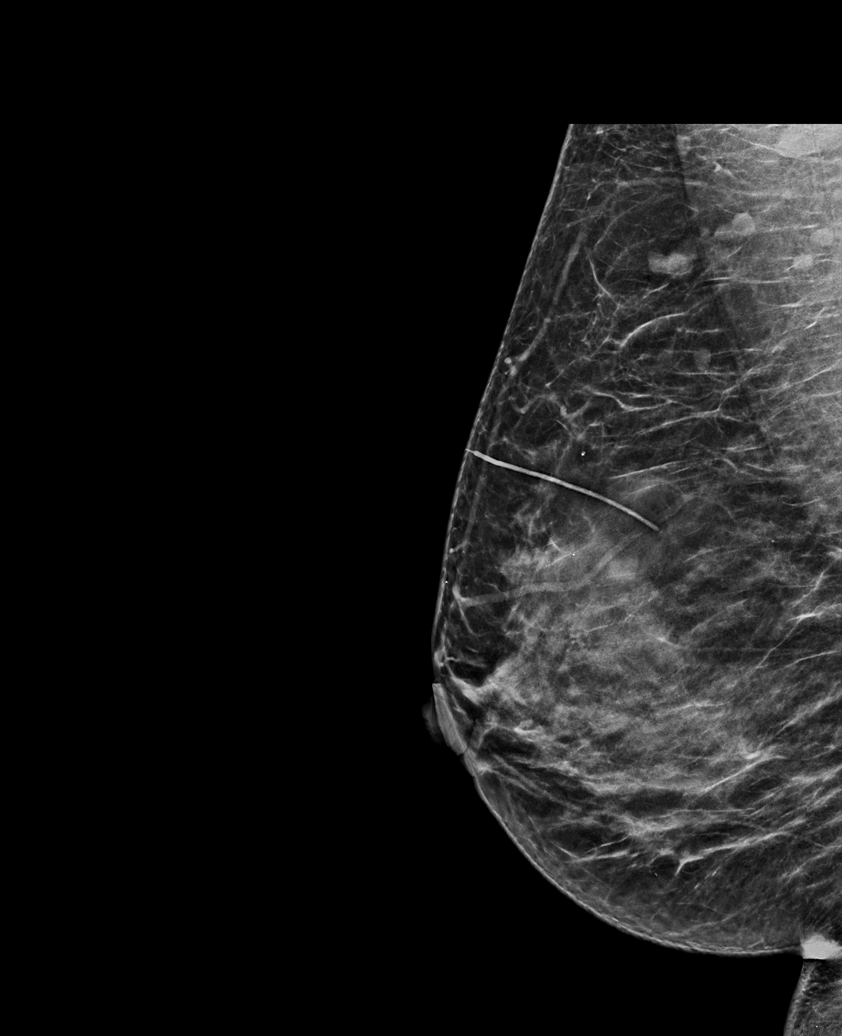

[R CC synth-2D]
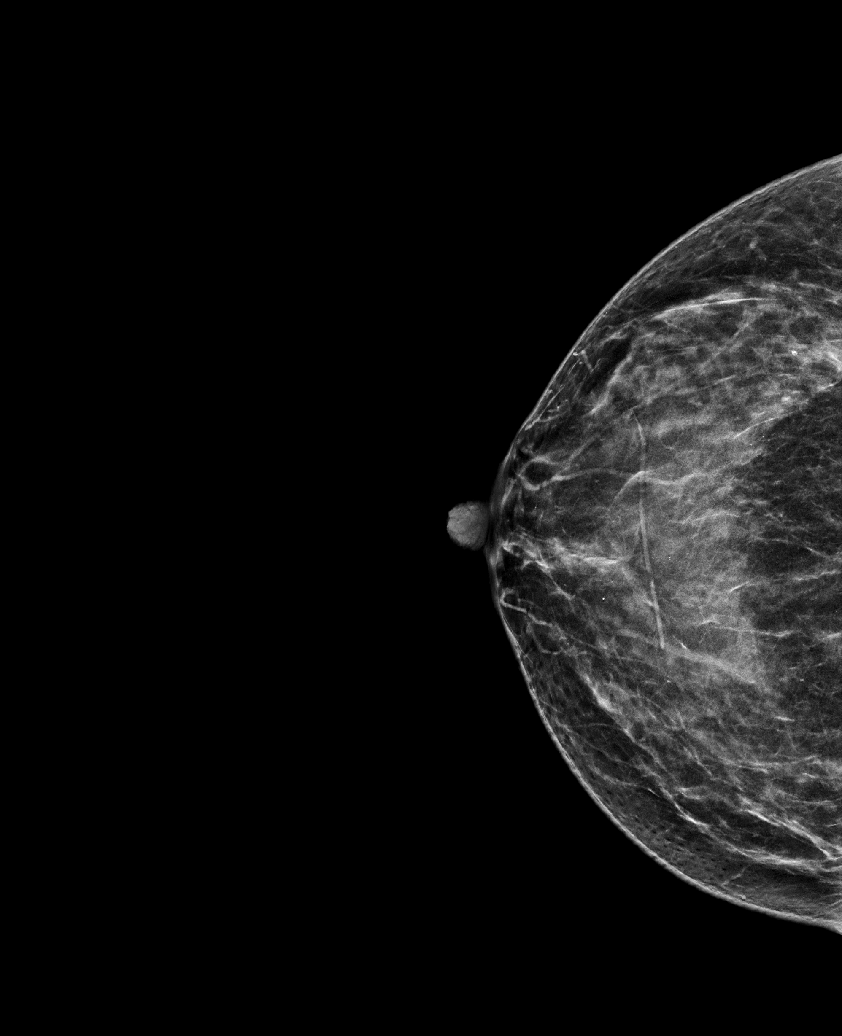

[L MLO synth-2D]
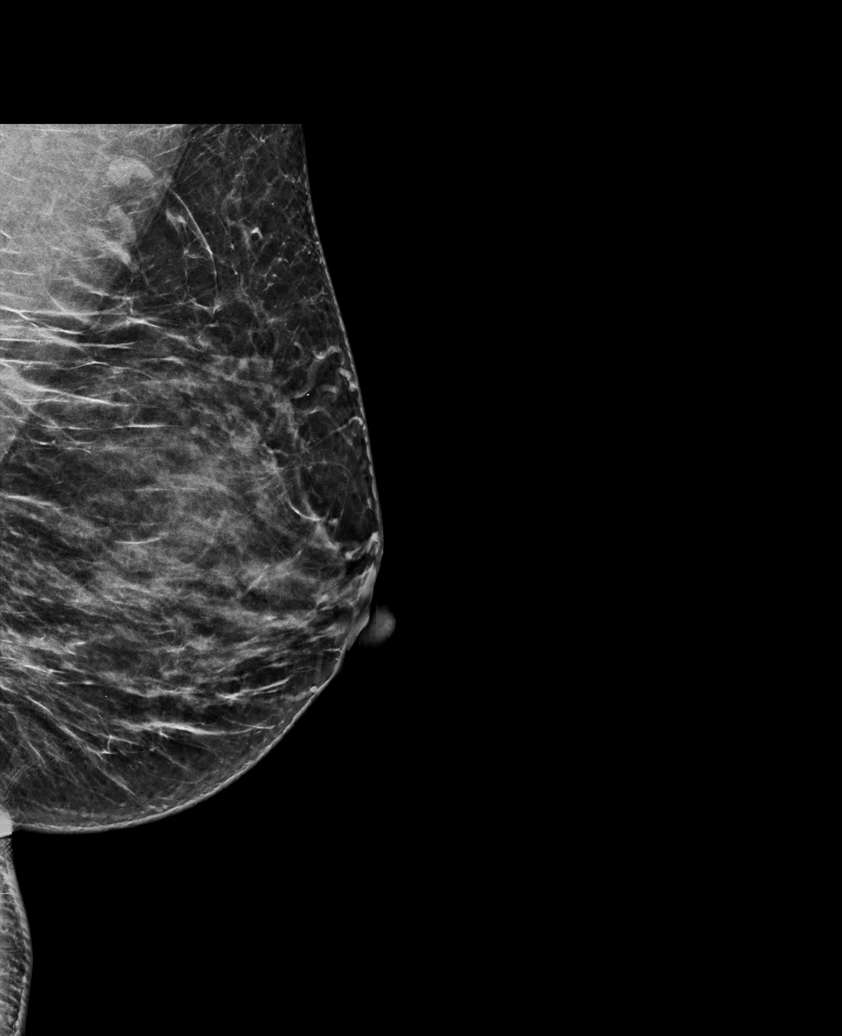

[L CC synth-2D (2 of 2)]
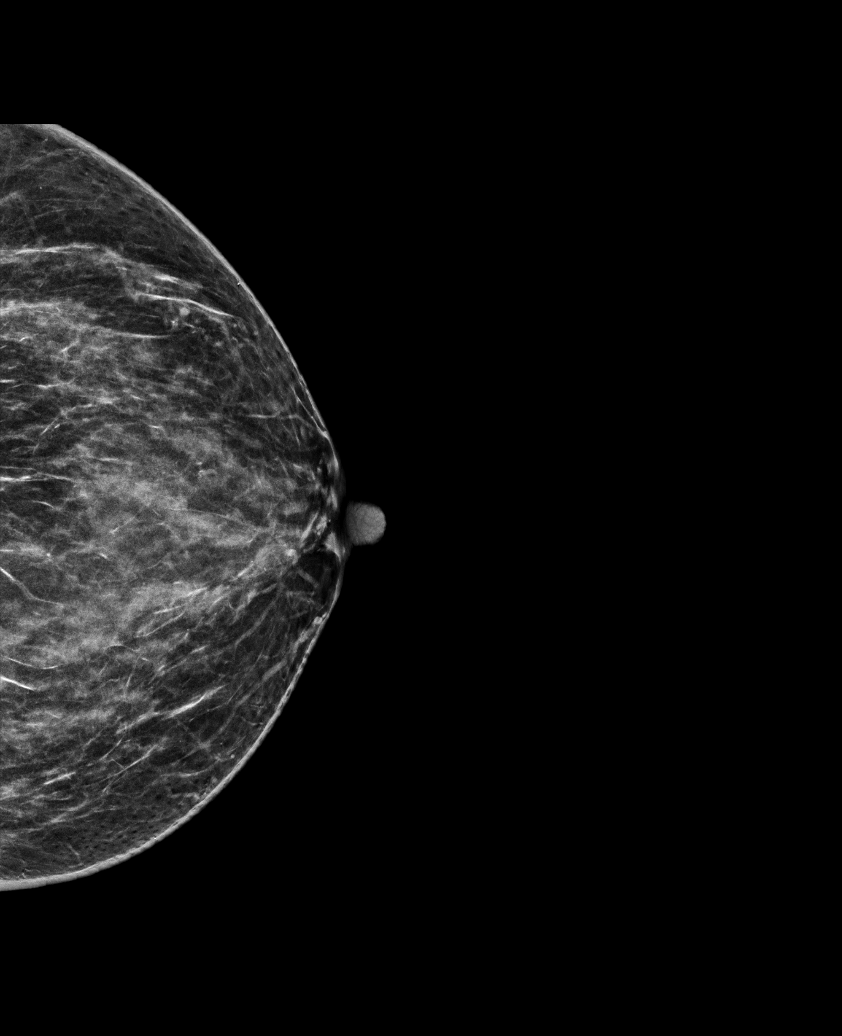

[R CC tomo · tomo slice 25/48.0]
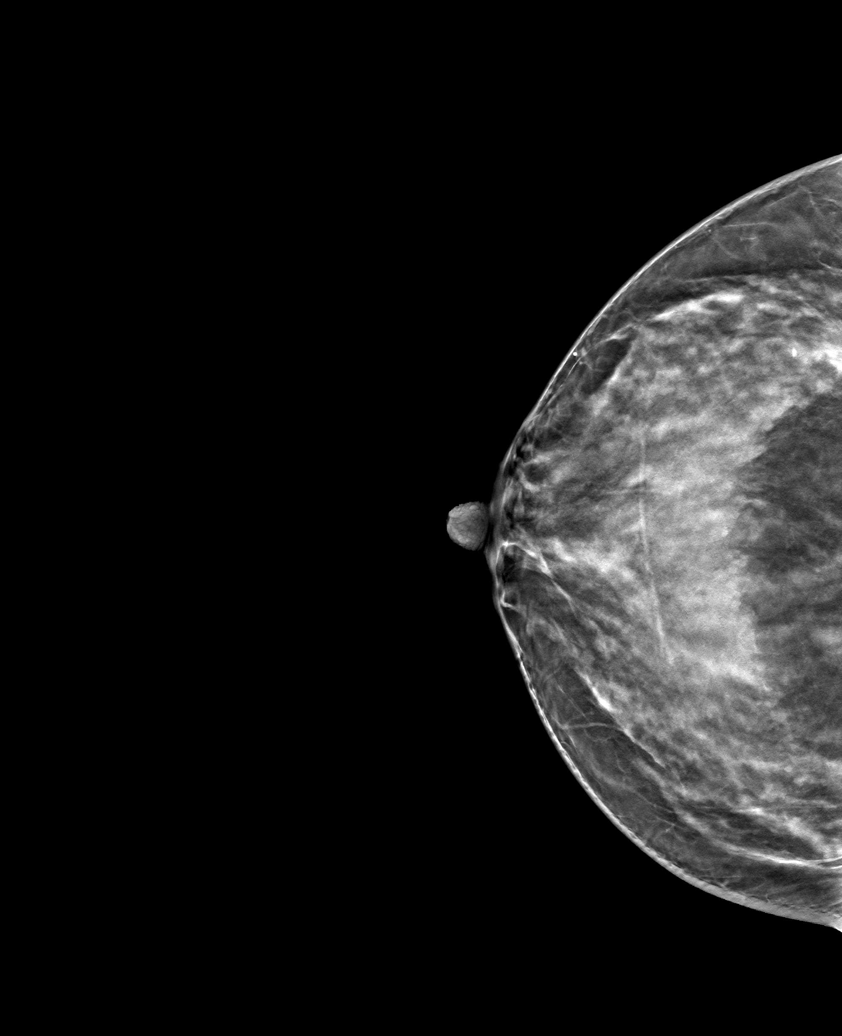

[6 of 30 positions shown; findings below may reference images not displayed]

ACR Breast Density Category c: The breast tissue is heterogeneously
dense, which may obscure small masses
FINDINGS: There are no findings suspicious for malignancy.
IMPRESSION: No mammographic evidence of malignancy. A result letter of this
screening mammogram will be mailed directly to the patient.

RECOMMENDATION:
Screening mammogram in one year. (Code:A7-O-PCM)

BI-RADS CATEGORY  1: Negative.

## 2023-04-06 ENCOUNTER — Other Ambulatory Visit: Payer: Self-pay | Admitting: Internal Medicine

## 2023-04-07 LAB — COMPLETE METABOLIC PANEL WITH GFR
AG Ratio: 1.4 (calc) (ref 1.0–2.5)
ALT: 14 U/L (ref 6–29)
AST: 17 U/L (ref 10–30)
Albumin: 3.9 g/dL (ref 3.6–5.1)
Alkaline phosphatase (APISO): 52 U/L (ref 31–125)
BUN: 12 mg/dL (ref 7–25)
CO2: 24 mmol/L (ref 20–32)
Calcium: 9.3 mg/dL (ref 8.6–10.2)
Chloride: 105 mmol/L (ref 98–110)
Creat: 0.92 mg/dL (ref 0.50–0.99)
Globulin: 2.7 g/dL (calc) (ref 1.9–3.7)
Glucose, Bld: 67 mg/dL (ref 65–99)
Potassium: 4 mmol/L (ref 3.5–5.3)
Sodium: 139 mmol/L (ref 135–146)
Total Bilirubin: 0.4 mg/dL (ref 0.2–1.2)
Total Protein: 6.6 g/dL (ref 6.1–8.1)
eGFR: 80 mL/min/{1.73_m2} (ref >=60)

## 2023-04-07 LAB — CBC
HCT: 45.6 % — ABNORMAL HIGH (ref 35.0–45.0)
Hemoglobin: 14.5 g/dL (ref 11.7–15.5)
MCH: 29.7 pg (ref 27.0–33.0)
MCHC: 31.8 g/dL — ABNORMAL LOW (ref 32.0–36.0)
MCV: 93.4 fL (ref 80.0–100.0)
MPV: 10.8 fL (ref 7.5–12.5)
Platelets: 234 10*3/uL (ref 140–400)
RBC: 4.88 10*6/uL (ref 3.80–5.10)
RDW: 13 % (ref 11.0–15.0)
WBC: 7 10*3/uL (ref 3.8–10.8)

## 2023-04-07 LAB — LIPID PANEL
Cholesterol: 132 mg/dL (ref <200)
HDL: 41 mg/dL — ABNORMAL LOW (ref >=50)
LDL Cholesterol (Calc): 72 mg/dL (calc)
Non-HDL Cholesterol (Calc): 91 mg/dL (calc) (ref <130)
Total CHOL/HDL Ratio: 3.2 (calc) (ref <5.0)
Triglycerides: 109 mg/dL (ref <150)

## 2023-04-07 LAB — VITAMIN D 25 HYDROXY (VIT D DEFICIENCY, FRACTURES): Vit D, 25-Hydroxy: 27 ng/mL — ABNORMAL LOW (ref 30–100)

## 2023-04-07 LAB — TSH: TSH: 1.2 mIU/L

## 2023-04-28 ENCOUNTER — Encounter: Payer: Self-pay | Admitting: Podiatry

## 2023-04-28 ENCOUNTER — Ambulatory Visit (INDEPENDENT_AMBULATORY_CARE_PROVIDER_SITE_OTHER): Payer: Medicaid Other | Admitting: Podiatry

## 2023-04-28 ENCOUNTER — Ambulatory Visit (INDEPENDENT_AMBULATORY_CARE_PROVIDER_SITE_OTHER): Payer: Medicaid Other

## 2023-04-28 DIAGNOSIS — M722 Plantar fascial fibromatosis: Secondary | ICD-10-CM

## 2023-04-28 NOTE — Progress Notes (Signed)
  Subjective:  Patient ID: Sarah Sanchez, female    DOB: January 18, 1981,  MRN: [id number]  Chief Complaint  Patient presents with   Plantar Fasciitis    Left foot pain. Has been hurting patient for the last three months. 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 It mainly hurts in the plantar area. Aggravates factors are when she's standing and when she walks. Treatment has been icing and pain meds.     42 y.o. female presents with the above complaint.  Patient presents with left heel pain that has been going for quite some time is progressive gotten worse worse with ambulation worse with pressure rest of the HPI as above.   Review of Systems: Negative except as noted in the HPI. Denies N/V/F/Ch.  No past medical history on file.  Current Outpatient Medications:    benzonatate (TESSALON) 100 MG capsule, Take 1 capsule (100 mg total) by mouth every 8 (eight) hours., Disp: 21 capsule, Rfl: 0   diclofenac (VOLTAREN) 75 MG EC tablet, Take 1 tablet (75 mg total) by mouth 2 (two) times daily., Disp: 30 tablet, Rfl: 0   ondansetron (ZOFRAN ODT) 4 MG disintegrating tablet, Take 1 tablet (4 mg total) by mouth every 8 (eight) hours as needed for nausea or vomiting., Disp: 20 tablet, Rfl: 0  Social History   Tobacco Use  Smoking Status Never  Smokeless Tobacco Never    No Known Allergies Objective:  There were no vitals filed for this visit. There is no height or weight on file to calculate BMI. Constitutional Well developed. Well nourished.  Vascular Dorsalis pedis pulses palpable bilaterally. Posterior tibial pulses palpable bilaterally. Capillary refill normal to all digits.  No cyanosis or clubbing noted. Pedal hair growth normal.  Neurologic Normal speech. Oriented to person, place, and time. Epicritic sensation to light touch grossly present bilaterally.  Dermatologic Nails well groomed and normal in appearance. No open wounds. No skin lesions.  Orthopedic: Normal joint ROM  without pain or crepitus bilaterally. No visible deformities. Tender to palpation at the calcaneal tuber left. No pain with calcaneal squeeze left. Ankle ROM diminished range of motion left. Silfverskiold Test: positive left.   Radiographs: Taken and reviewed. No acute fractures or dislocations. No evidence of stress fracture.  Plantar heel spur present. Posterior heel spur absent.   Assessment:   1. Plantar fasciitis of left foot    Plan:  Patient was evaluated and treated and all questions answered.  Plantar Fasciitis, left - XR reviewed as above.  - Educated on icing and stretching. Instructions given.  - Injection delivered to the plantar fascia as below. - DME: Plantar fascial brace dispensed to support the medial longitudinal arch of the foot and offload pressure from the heel and prevent arch collapse during weightbearing - Pharmacologic management: None  Procedure: Injection Tendon/Ligament Location: Left plantar fascia at the glabrous junction; medial approach. Skin Prep: alcohol Injectate: 0.5 cc 0.5% marcaine plain, 0.5 cc of 1% Lidocaine, 0.5 cc kenalog 10. Disposition: Patient tolerated procedure well. Injection site dressed with a band-aid.  No follow-ups on file.

## 2023-06-02 ENCOUNTER — Ambulatory Visit (INDEPENDENT_AMBULATORY_CARE_PROVIDER_SITE_OTHER): Payer: Medicaid Other | Admitting: Podiatry

## 2023-06-02 ENCOUNTER — Encounter: Payer: Self-pay | Admitting: Podiatry

## 2023-06-02 DIAGNOSIS — M722 Plantar fascial fibromatosis: Secondary | ICD-10-CM | POA: Diagnosis not present

## 2023-06-02 DIAGNOSIS — M62462 Contracture of muscle, left lower leg: Secondary | ICD-10-CM | POA: Diagnosis not present

## 2023-06-02 NOTE — Progress Notes (Signed)
  Subjective:  Patient ID: Sarah Sanchez, female    DOB: Apr 08, 1981,  MRN: [id number]  Chief Complaint  Patient presents with   Plantar Fasciitis    Still having pain she stated that the injection did help for a little while     42 y.o. female presents with the above complaint.  Patient presents with follow-up for left heel pain that has been going for quite some time is progressive gotten worse but the injection did help some.  She been wearing the brace she denies any other acute complaints   Review of Systems: Negative except as noted in the HPI. Denies N/V/F/Ch.  No past medical history on file.  Current Outpatient Medications:    benzonatate (TESSALON) 100 MG capsule, Take 1 capsule (100 mg total) by mouth every 8 (eight) hours., Disp: 21 capsule, Rfl: 0   diclofenac (VOLTAREN) 75 MG EC tablet, Take 1 tablet (75 mg total) by mouth 2 (two) times daily., Disp: 30 tablet, Rfl: 0   ondansetron (ZOFRAN ODT) 4 MG disintegrating tablet, Take 1 tablet (4 mg total) by mouth every 8 (eight) hours as needed for nausea or vomiting., Disp: 20 tablet, Rfl: 0  Social History   Tobacco Use  Smoking Status Never  Smokeless Tobacco Never    No Known Allergies Objective:  There were no vitals filed for this visit. There is no height or weight on file to calculate BMI. Constitutional Well developed. Well nourished.  Vascular Dorsalis pedis pulses palpable bilaterally. Posterior tibial pulses palpable bilaterally. Capillary refill normal to all digits.  No cyanosis or clubbing noted. Pedal hair growth normal.  Neurologic Normal speech. Oriented to person, place, and time. Epicritic sensation to light touch grossly present bilaterally.  Dermatologic Nails well groomed and normal in appearance. No open wounds. No skin lesions.  Orthopedic: Normal joint ROM without pain or crepitus bilaterally. No visible deformities. Tender to palpation at the calcaneal tuber left. No pain with calcaneal  squeeze left. Ankle ROM diminished range of motion left. Silfverskiold Test: positive left.   Radiographs: Taken and reviewed. No acute fractures or dislocations. No evidence of stress fracture.  Plantar heel spur present. Posterior heel spur absent.   Assessment:   1. Plantar fasciitis of left foot   2. Gastrocnemius equinus, left     Plan:  Patient was evaluated and treated and all questions answered.  Plantar Fasciitis, left with gastrocnemius equinus - XR reviewed as above.  - Educated on icing and stretching. Instructions given.  -Second injection delivered to the plantar fascia as below. - DME: Plantar fascial brace dispensed to support the medial longitudinal arch of the foot and offload pressure from the heel and prevent arch collapse during weightbearing - Pharmacologic management: None  Procedure: Injection Tendon/Ligament Location: Left plantar fascia at the glabrous junction; medial approach. Skin Prep: alcohol Injectate: 0.5 cc 0.5% marcaine plain, 0.5 cc of 1% Lidocaine, 0.5 cc kenalog 10. Disposition: Patient tolerated procedure well. Injection site dressed with a band-aid.  No follow-ups on file.

## 2023-07-05 ENCOUNTER — Encounter: Payer: Self-pay | Admitting: Podiatry

## 2023-07-05 ENCOUNTER — Ambulatory Visit (INDEPENDENT_AMBULATORY_CARE_PROVIDER_SITE_OTHER): Payer: Medicaid Other | Admitting: Podiatry

## 2023-07-05 DIAGNOSIS — M722 Plantar fascial fibromatosis: Secondary | ICD-10-CM | POA: Diagnosis not present

## 2023-07-05 DIAGNOSIS — M62462 Contracture of muscle, left lower leg: Secondary | ICD-10-CM

## 2023-07-05 NOTE — Progress Notes (Signed)
  Subjective:  Patient ID: Sarah Sanchez, female    DOB: [birth year],  MRN: [id number]  Chief Complaint  Patient presents with   Plantar Fasciitis    Patient is here for 4 WK F/U for PF of left foot    42 y.o. female presents with the above complaint.  Patient states before.  Plantar fasciitis.  She states injection did not help much.  Discussed Plan  Review of Systems: Negative except as noted in the HPI. Denies N/V/F/Ch.  History reviewed. No pertinent past medical history.  Current Outpatient Medications:    diclofenac (VOLTAREN) 75 MG EC tablet, Take 1 tablet (75 mg total) by mouth 2 (two) times daily., Disp: 30 tablet, Rfl: 0   ondansetron (ZOFRAN ODT) 4 MG disintegrating tablet, Take 1 tablet (4 mg total) by mouth every 8 (eight) hours as needed for nausea or vomiting., Disp: 20 tablet, Rfl: 0  Social History   Tobacco Use  Smoking Status Never  Smokeless Tobacco Never    No Known Allergies Objective:  There were no vitals filed for this visit. There is no height or weight on file to calculate BMI. Constitutional Well developed. Well nourished.  Vascular Dorsalis pedis pulses palpable bilaterally. Posterior tibial pulses palpable bilaterally. Capillary refill normal to all digits.  No cyanosis or clubbing noted. Pedal hair growth normal.  Neurologic Normal speech. Oriented to person, place, and time. Epicritic sensation to light touch grossly present bilaterally.  Dermatologic Nails well groomed and normal in appearance. No open wounds. No skin lesions.  Orthopedic: Normal joint ROM without pain or crepitus bilaterally. No visible deformities. Tender to palpation at the calcaneal tuber left. No pain with calcaneal squeeze left. Ankle ROM diminished range of motion left. Silfverskiold Test: positive left.   Radiographs: Taken and reviewed. No acute fractures or dislocations. No evidence of stress fracture.  Plantar heel spur present. Posterior heel spur absent.    Assessment:   No diagnosis found.   Plan:  Patient was evaluated and treated and all questions answered.  Plantar Fasciitis, left with gastrocnemius equinus - XR reviewed as above.  - Educated on icing and stretching. Instructions given.  -Second injection delivered to the plantar fascia as below. - DME: Cam boot - Pharmacologic management: None    No follow-ups on file.

## 2023-08-02 ENCOUNTER — Ambulatory Visit (INDEPENDENT_AMBULATORY_CARE_PROVIDER_SITE_OTHER): Payer: Medicaid Other | Admitting: Podiatry

## 2023-08-02 DIAGNOSIS — M62462 Contracture of muscle, left lower leg: Secondary | ICD-10-CM | POA: Diagnosis not present

## 2023-08-02 DIAGNOSIS — Z01818 Encounter for other preprocedural examination: Secondary | ICD-10-CM | POA: Diagnosis not present

## 2023-08-02 DIAGNOSIS — M722 Plantar fascial fibromatosis: Secondary | ICD-10-CM | POA: Diagnosis not present

## 2023-08-02 NOTE — Progress Notes (Signed)
Subjective:  Patient ID: Sarah Sanchez, female    DOB: September 19, 1981,  MRN: [id number]  Chief Complaint  Patient presents with   Plantar Fasciitis    Pt stated that she is still having pain     42 y.o. female presents with the above complaint.  Patient states before.  Plantar fasciitis.  She states injection did not help much.  Cam boot not helped.  She would like to discuss surgical options  Review of Systems: Negative except as noted in the HPI. Denies N/V/F/Ch.  No past medical history on file.  Current Outpatient Medications:    diclofenac (VOLTAREN) 75 MG EC tablet, Take 1 tablet (75 mg total) by mouth 2 (two) times daily., Disp: 30 tablet, Rfl: 0   ondansetron (ZOFRAN ODT) 4 MG disintegrating tablet, Take 1 tablet (4 mg total) by mouth every 8 (eight) hours as needed for nausea or vomiting., Disp: 20 tablet, Rfl: 0  Social History   Tobacco Use  Smoking Status Never  Smokeless Tobacco Never    No Known Allergies Objective:  There were no vitals filed for this visit. There is no height or weight on file to calculate BMI. Constitutional Well developed. Well nourished.  Vascular Dorsalis pedis pulses palpable bilaterally. Posterior tibial pulses palpable bilaterally. Capillary refill normal to all digits.  No cyanosis or clubbing noted. Pedal hair growth normal.  Neurologic Normal speech. Oriented to person, place, and time. Epicritic sensation to light touch grossly present bilaterally.  Dermatologic Nails well groomed and normal in appearance. No open wounds. No skin lesions.  Orthopedic: Normal joint ROM without pain or crepitus bilaterally. No visible deformities. Tender to palpation at the calcaneal tuber left. No pain with calcaneal squeeze left. Ankle ROM diminished range of motion left. Silfverskiold Test: positive left.   Radiographs: Taken and reviewed. No acute fractures or dislocations. No evidence of stress fracture.  Plantar heel spur present. Posterior  heel spur absent.   Assessment:   1. Plantar fasciitis of left foot   2. Gastrocnemius equinus, left   3. Encounter for preoperative examination for general surgical procedure      Plan:  Patient was evaluated and treated and all questions answered.  Plantar Fasciitis, left with gastrocnemius equinus -Clinically patient continued to fail all conservative care including injection cam boot immobilization shoe gear modification I believe patient will benefit from left endoscopic plantar fasciotomy with gastrocnemius recession.  I discussed with the patient preoperative intra postop plan in extensive detail she states understand like to proceed with surgery -Informed surgical risk consent was reviewed and read aloud to the patient.  I reviewed the films.  I have discussed my findings with the patient in great detail.  I have discussed all risks including but not limited to infection, stiffness, scarring, limp, disability, deformity, damage to blood vessels and nerves, numbness, poor healing, need for braces, arthritis, chronic pain, amputation, death.  All benefits and realistic expectations discussed in great detail.  I have made no promises as to the outcome.  I have provided realistic expectations.  I have offered the patient a 2nd opinion, which they have declined and assured me they preferred to proceed despite the risks     No follow-ups on file.

## 2023-10-02 ENCOUNTER — Other Ambulatory Visit: Payer: Self-pay | Admitting: Internal Medicine

## 2023-10-02 DIAGNOSIS — Z1231 Encounter for screening mammogram for malignant neoplasm of breast: Secondary | ICD-10-CM

## 2023-10-05 ENCOUNTER — Ambulatory Visit
Admission: RE | Admit: 2023-10-05 | Discharge: 2023-10-05 | Disposition: A | Discharge status: Home / Self Care | Payer: Medicaid Other | Source: Ambulatory Visit | Attending: Internal Medicine | Admitting: Internal Medicine

## 2023-10-05 DIAGNOSIS — Z1231 Encounter for screening mammogram for malignant neoplasm of breast: Secondary | ICD-10-CM

## 2023-10-13 ENCOUNTER — Telehealth: Payer: Self-pay | Admitting: Podiatry

## 2023-10-13 NOTE — Telephone Encounter (Signed)
Pt called stating that she would like to go ahead and schedule her surgery with Dr.Patel. When she was seen last she signed all the consent forms. The best number to reach her at [phone], she said that she might be at work when you call to please leave a voicemail, she also stated that she was trying to get her surgery scheduled for the end of next week due to time off.

## 2023-10-23 ENCOUNTER — Telehealth: Payer: Self-pay | Admitting: Urology

## 2023-10-23 NOTE — Telephone Encounter (Signed)
Called pt to schedule her sx with Dr. Allena Katz.

## 2023-12-14 ENCOUNTER — Other Ambulatory Visit: Payer: Self-pay | Admitting: Internal Medicine

## 2023-12-14 DIAGNOSIS — N644 Mastodynia: Secondary | ICD-10-CM

## 2023-12-26 ENCOUNTER — Ambulatory Visit
Admission: RE | Admit: 2023-12-26 | Discharge: 2023-12-26 | Disposition: A | Discharge status: Home / Self Care | Payer: Medicaid Other | Source: Ambulatory Visit | Attending: Internal Medicine | Admitting: Internal Medicine

## 2023-12-26 ENCOUNTER — Ambulatory Visit: Payer: Medicaid Other

## 2023-12-26 DIAGNOSIS — N644 Mastodynia: Secondary | ICD-10-CM

## 2024-03-13 ENCOUNTER — Ambulatory Visit (INDEPENDENT_AMBULATORY_CARE_PROVIDER_SITE_OTHER): Admitting: Podiatry

## 2024-03-13 DIAGNOSIS — M62462 Contracture of muscle, left lower leg: Secondary | ICD-10-CM

## 2024-03-13 DIAGNOSIS — M722 Plantar fascial fibromatosis: Secondary | ICD-10-CM | POA: Diagnosis not present

## 2024-03-13 DIAGNOSIS — Z01818 Encounter for other preprocedural examination: Secondary | ICD-10-CM

## 2024-03-13 NOTE — Progress Notes (Signed)
  Subjective:  Patient ID: Sarah Sanchez, female    DOB: 09-25-1981,  MRN: [id number]  Chief Complaint  Patient presents with   Plantar Fasciitis    43 y.o. female presents with the above complaint.  Patient states before.  Plantar fasciitis.  She states injection did not help much.  Cam boot not helped.  She would like to discuss surgical options  Review of Systems: Negative except as noted in the HPI. Denies N/V/F/Ch.  No past medical history on file.  Current Outpatient Medications:    celecoxib (CELEBREX) 200 MG capsule, Take 200 mg by mouth daily., Disp: , Rfl:    gabapentin (NEURONTIN) 300 MG capsule, Take 300 mg by mouth daily., Disp: , Rfl:    diclofenac  (VOLTAREN ) 75 MG EC tablet, Take 1 tablet (75 mg total) by mouth 2 (two) times daily., Disp: 30 tablet, Rfl: 0   ondansetron  (ZOFRAN  ODT) 4 MG disintegrating tablet, Take 1 tablet (4 mg total) by mouth every 8 (eight) hours as needed for nausea or vomiting., Disp: 20 tablet, Rfl: 0  Social History   Tobacco Use  Smoking Status Never  Smokeless Tobacco Never    No Known Allergies Objective:  There were no vitals filed for this visit. There is no height or weight on file to calculate BMI. Constitutional Well developed. Well nourished.  Vascular Dorsalis pedis pulses palpable bilaterally. Posterior tibial pulses palpable bilaterally. Capillary refill normal to all digits.  No cyanosis or clubbing noted. Pedal hair growth normal.  Neurologic Normal speech. Oriented to person, place, and time. Epicritic sensation to light touch grossly present bilaterally.  Dermatologic Nails well groomed and normal in appearance. No open wounds. No skin lesions.  Orthopedic: Normal joint ROM without pain or crepitus bilaterally. No visible deformities. Tender to palpation at the calcaneal tuber left. No pain with calcaneal squeeze left. Ankle ROM diminished range of motion left. Silfverskiold Test: positive left.   Radiographs:  Taken and reviewed. No acute fractures or dislocations. No evidence of stress fracture.  Plantar heel spur present. Posterior heel spur absent.   Assessment:   No diagnosis found.    Plan:  Patient was evaluated and treated and all questions answered.  Plantar Fasciitis, left with gastrocnemius equinus -Clinically patient continued to fail all conservative care including injection cam boot immobilization shoe gear modification I believe patient will benefit from left endoscopic plantar fasciotomy with gastrocnemius recession.  I discussed with the patient preoperative intra postop plan in extensive detail she states understand like to proceed with surgery -Informed surgical risk consent was reviewed and read aloud to the patient.  I reviewed the films.  I have discussed my findings with the patient in great detail.  I have discussed all risks including but not limited to infection, stiffness, scarring, limp, disability, deformity, damage to blood vessels and nerves, numbness, poor healing, need for braces, arthritis, chronic pain, amputation, death.  All benefits and realistic expectations discussed in great detail.  I have made no promises as to the outcome.  I have provided realistic expectations.  I have offered the patient a 2nd opinion, which they have declined and assured me they preferred to proceed despite the risks     No follow-ups on file.

## 2024-03-20 ENCOUNTER — Telehealth: Payer: Self-pay | Admitting: Podiatry

## 2024-03-20 NOTE — Telephone Encounter (Signed)
 Pt returned call today at 1206pm returning call to get scheduled for surgery. She request 3rd week in July.  I returned call and left message to have her call back that we could do 7/21 or 7/28.

## 2024-03-20 NOTE — Telephone Encounter (Signed)
 Pt called back and is scheduled for surgery for 7/28. She was asking how long the recovery time is? She may need a note to give to her work. Please advise

## 2024-03-20 NOTE — Telephone Encounter (Signed)
 RECEIVED SURGICAL CONSENT Left message for pt to call to schedule her surgery.

## 2024-04-22 DIAGNOSIS — N390 Urinary tract infection, site not specified: Secondary | ICD-10-CM | POA: Diagnosis not present

## 2024-04-22 DIAGNOSIS — E559 Vitamin D deficiency, unspecified: Secondary | ICD-10-CM | POA: Diagnosis not present

## 2024-04-22 DIAGNOSIS — N760 Acute vaginitis: Secondary | ICD-10-CM | POA: Diagnosis not present

## 2024-04-22 DIAGNOSIS — Z13220 Encounter for screening for lipoid disorders: Secondary | ICD-10-CM | POA: Diagnosis not present

## 2024-04-23 ENCOUNTER — Encounter: Payer: Self-pay | Admitting: Podiatry

## 2024-05-07 ENCOUNTER — Telehealth: Payer: Self-pay | Admitting: Podiatry

## 2024-05-07 DIAGNOSIS — Z0271 Encounter for disability determination: Secondary | ICD-10-CM

## 2024-05-07 NOTE — Telephone Encounter (Signed)
 Pt left [facility] forms. I called to adv her they are read for pick up at [facility] office and let her know copy was made for her records. DOS 06/17/24. RTW approx 07/26/24

## 2024-06-03 ENCOUNTER — Telehealth: Payer: Self-pay | Admitting: Podiatry

## 2024-06-03 NOTE — Telephone Encounter (Signed)
 Pt left message stating she has questions about her surgery that is scheduled on 7/28. Should she go to work as no one has given her any information.  I returned call and left message that as far as time the surgery center calls them 24 to 48 hours prior to surgery to tell them the time they need to arrive there. I only schedule the date of the surgery. I did give her the number for the surgery center and told her to call me if she needs to change the date.

## 2024-06-12 ENCOUNTER — Telehealth: Payer: Self-pay | Admitting: Podiatry

## 2024-06-12 NOTE — Telephone Encounter (Signed)
 Received call from Orien at [facility] solutions for [employer] and the surgery was denied case ref # [id number] due to not had physical therapy or at home exercises and no night splint was dispensed.   Did you want me to set up a peer to peer.  The number is [phone]   Or it can be resubmitted for reconsideration and the fax number to fax records is [phone].SABRA  Pts surgery is scheduled 7/28

## 2024-06-12 NOTE — Telephone Encounter (Signed)
 Faxed notes for reconsideration

## 2024-06-13 NOTE — Telephone Encounter (Signed)
 I faxed notes yesterday for reconsideration.  I also called pt and made her aware that the insurance did deny the surgery but we have resubmitted it for reconsideration and waiting to hear back about it. I told pt I would call to let her know as soon as I get the information from the insurance company. We may have to post pone

## 2024-06-14 ENCOUNTER — Other Ambulatory Visit: Payer: Self-pay | Admitting: Podiatry

## 2024-06-14 DIAGNOSIS — M722 Plantar fascial fibromatosis: Secondary | ICD-10-CM

## 2024-06-17 ENCOUNTER — Telehealth: Payer: Self-pay | Admitting: Podiatry

## 2024-06-17 NOTE — Telephone Encounter (Deleted)
 DOS-   ENDOSCOPIC PLANTAR FASCIOTOMY LT- 29893 GASTROCNEMIUS RECESS LT- 72312   [employer] EFFECTIVE DATE- 02/20/2024   DEDUCTIBLE- $7500 REMAINING- $7500 FAMILY DEDUCTIBLE- $15000 REMAINING- $15000 OOP- $9200 REMAINING- $9191.80 FAMILY OOP- $18400 REMAINING- 18391.80 COINSURANCE- 50% WITH $0 COPAY   PER [employer] AUTOMATED SYSTEM, NO PRIOR AUTH REQUIRED FOR CPT CODES 70106 AND [phone].  REF# [id number]

## 2024-06-17 NOTE — Telephone Encounter (Signed)
 Pt came in the office today and gave front desk an [employer]/ [employer] card, we did not have that on file. Insurance was called and not shara is needed for the surgery that was originally scheduled for today but was denied by [employer].  I called pt and mailbox is full. Was calling to see if she could do the surgery on 8/4.

## 2024-06-17 NOTE — Telephone Encounter (Signed)
 DOS-   ENDOSCOPIC PLANTAR FASCIOTOMY LT- 29893 GASTROCNEMIUS RECESS LT- 72312   [employer] EFFECTIVE DATE- 02/20/2024   DEDUCTIBLE- $7500 REMAINING- $7500 FAMILY DEDUCTIBLE- $15000 REMAINING- $15000 OOP- $9200 REMAINING- $9191.80 FAMILY OOP- $18400 REMAINING- 18391.80 COINSURANCE- 50% WITH $0 COPAY   PER [employer] AUTOMATED SYSTEM, NO PRIOR AUTH REQUIRED FOR CPT CODES 70106 AND [phone].  REF# [id number]

## 2024-06-17 NOTE — Telephone Encounter (Signed)
 ERROR

## 2024-06-19 NOTE — Telephone Encounter (Signed)
 Pt called stating someone was to get back to her since she brought in her new insurance card and was to be checking for surgery benefits.   Upon checking I called pt and mailbox was full and I did send my chart message as well but she is not able to get into her mychart account.  I have her r/s for the surgery on 8/4 as the new insurance does not need prior authorization.

## 2024-06-24 ENCOUNTER — Other Ambulatory Visit: Payer: Self-pay | Admitting: Podiatry

## 2024-06-24 DIAGNOSIS — M216X2 Other acquired deformities of left foot: Secondary | ICD-10-CM | POA: Diagnosis not present

## 2024-06-24 DIAGNOSIS — M722 Plantar fascial fibromatosis: Secondary | ICD-10-CM | POA: Diagnosis not present

## 2024-06-24 MED ORDER — OXYCODONE-ACETAMINOPHEN 5-325 MG PO TABS: 1.0000 | ORAL_TABLET | ORAL | 0 refills | Status: AC | PRN

## 2024-06-24 MED ORDER — IBUPROFEN 800 MG PO TABS: 800.0000 mg | ORAL_TABLET | Freq: Four times a day (QID) | ORAL | 1 refills | Status: AC | PRN

## 2024-06-25 ENCOUNTER — Telehealth: Payer: Self-pay | Admitting: Podiatry

## 2024-06-25 NOTE — Telephone Encounter (Signed)
 Patient called to find out if she was supposed to receive crutches after surgery. She is home alone sometimes and bearing weight on it is painful.

## 2024-06-25 NOTE — Telephone Encounter (Signed)
 error

## 2024-06-25 NOTE — Telephone Encounter (Signed)
 Patient would like to proceed with crutches script. How will she receive the crutches? Does the [facility] deliver ? If not what is the location ?

## 2024-06-26 ENCOUNTER — Encounter: Admitting: Podiatry

## 2024-06-26 NOTE — Telephone Encounter (Signed)
 cld pt bk from mess she left on my vmail. She wanted to know about filing for Disablity. I adv her we only did FMLA/std/ltd if filed thru her employer.

## 2024-06-28 ENCOUNTER — Telehealth: Payer: Self-pay | Admitting: Podiatry

## 2024-06-28 DIAGNOSIS — R2689 Other abnormalities of gait and mobility: Secondary | ICD-10-CM

## 2024-06-28 NOTE — Telephone Encounter (Signed)
 Spoke to patient will be on her way to pick up now.

## 2024-06-28 NOTE — Telephone Encounter (Signed)
 Are we able to up load order to MyChart

## 2024-06-28 NOTE — Telephone Encounter (Signed)
 Thank you Irma, and Dr.

## 2024-06-28 NOTE — Telephone Encounter (Signed)
 The patient is still waiting for a response regarding the order for crutches. Please advise on the current status.  She may also need to be contacted directly, as she is concerned about not receiving crutches after being instructed to stay off her foot and avoid applying pressure. She is questioning how she is expected to get around without them.  Thank you.

## 2024-07-03 ENCOUNTER — Encounter: Payer: Self-pay | Admitting: Podiatry

## 2024-07-03 ENCOUNTER — Ambulatory Visit (INDEPENDENT_AMBULATORY_CARE_PROVIDER_SITE_OTHER): Admitting: Podiatry

## 2024-07-03 DIAGNOSIS — Z9889 Other specified postprocedural states: Secondary | ICD-10-CM

## 2024-07-03 DIAGNOSIS — M722 Plantar fascial fibromatosis: Secondary | ICD-10-CM

## 2024-07-03 DIAGNOSIS — M62462 Contracture of muscle, left lower leg: Secondary | ICD-10-CM

## 2024-07-03 NOTE — Progress Notes (Signed)
  Subjective:  Patient ID: Sarah Sanchez, female    DOB: 13-Mar-1981,  MRN: [id number]  Chief Complaint  Patient presents with   Routine Post Op    POV # 1 DOS 06/24/24 LEFT EPF, LT GASTOC RECESSION    DOS: 06/24/2024 Procedure: Left EPF and left gastrocnemius recession  43 y.o. female returns for post-op check.  She states that she is doing well.  Denies any other acute complaints.  Pain is controlled bandages clean dry and intact.  Review of Systems: Negative except as noted in the HPI. Denies N/V/F/Ch.  No past medical history on file.  Current Outpatient Medications:    celecoxib (CELEBREX) 200 MG capsule, Take 200 mg by mouth daily., Disp: , Rfl:    diclofenac  (VOLTAREN ) 75 MG EC tablet, Take 1 tablet (75 mg total) by mouth 2 (two) times daily., Disp: 30 tablet, Rfl: 0   gabapentin (NEURONTIN) 300 MG capsule, Take 300 mg by mouth daily., Disp: , Rfl:    ibuprofen  (ADVIL ) 800 MG tablet, Take 1 tablet (800 mg total) by mouth every 6 (six) hours as needed., Disp: 60 tablet, Rfl: 1   ondansetron  (ZOFRAN  ODT) 4 MG disintegrating tablet, Take 1 tablet (4 mg total) by mouth every 8 (eight) hours as needed for nausea or vomiting., Disp: 20 tablet, Rfl: 0   oxyCODONE -acetaminophen  (PERCOCET) 5-325 MG tablet, Take 1 tablet by mouth every 4 (four) hours as needed for severe pain (pain score 7-10)., Disp: 30 tablet, Rfl: 0  Social History   Tobacco Use  Smoking Status Never  Smokeless Tobacco Never    No Known Allergies Objective:  There were no vitals filed for this visit. There is no height or weight on file to calculate BMI. Constitutional Well developed. Well nourished.  Vascular Foot warm and well perfused. Capillary refill normal to all digits.   Neurologic Normal speech. Oriented to person, place, and time. Epicritic sensation to light touch grossly present bilaterally.  Dermatologic Skin healing well without signs of infection. Skin edges well coapted without signs of infection.   Orthopedic: Tenderness to palpation noted about the surgical site.   Radiographs: None Assessment:   1. Gastrocnemius equinus, left   2. Plantar fasciitis of left foot   3. Status post foot surgery    Plan:  Patient was evaluated and treated and all questions answered.  S/p foot surgery left -Progressing as expected post-operatively. -XR: See above -WB Status: Weightbearing as tolerated in surgical shoe -Sutures: Intact.  No clinical signs of dehiscence no complication noted. -Medications: None -Foot redressed.  No follow-ups on file.

## 2024-07-10 ENCOUNTER — Encounter: Admitting: Podiatry

## 2024-07-17 ENCOUNTER — Ambulatory Visit (INDEPENDENT_AMBULATORY_CARE_PROVIDER_SITE_OTHER): Admitting: Podiatry

## 2024-07-17 ENCOUNTER — Encounter: Payer: Self-pay | Admitting: Podiatry

## 2024-07-17 DIAGNOSIS — M722 Plantar fascial fibromatosis: Secondary | ICD-10-CM

## 2024-07-17 DIAGNOSIS — Z9889 Other specified postprocedural states: Secondary | ICD-10-CM

## 2024-07-17 DIAGNOSIS — M62462 Contracture of muscle, left lower leg: Secondary | ICD-10-CM

## 2024-07-17 NOTE — Progress Notes (Signed)
  Subjective:  Patient ID: Sarah Sanchez, female    DOB: [birth year],  MRN: [id number]  Chief Complaint  Patient presents with   Routine Post Op    POV # 2 DOS 06/24/24 LEFT EPF, LT GASTOC RECESSION.  Only has pain when not wearing boot(does not wear in house)  no drainage.  Leg incision some redness and swelling.  Non Diabetic no anti coag    DOS: 06/24/2024 Procedure: Left EPF and left gastrocnemius recession  43 y.o. female returns for post-op check.  She states that she is doing well.  Denies any other acute complaints.  Pain is controlled bandages clean dry and intact.  Review of Systems: Negative except as noted in the HPI. Denies N/V/F/Ch.  No past medical history on file.  Current Outpatient Medications:    celecoxib (CELEBREX) 200 MG capsule, Take 200 mg by mouth daily., Disp: , Rfl:    diclofenac  (VOLTAREN ) 75 MG EC tablet, Take 1 tablet (75 mg total) by mouth 2 (two) times daily., Disp: 30 tablet, Rfl: 0   gabapentin (NEURONTIN) 300 MG capsule, Take 300 mg by mouth daily., Disp: , Rfl:    ibuprofen  (ADVIL ) 800 MG tablet, Take 1 tablet (800 mg total) by mouth every 6 (six) hours as needed., Disp: 60 tablet, Rfl: 1   ondansetron  (ZOFRAN  ODT) 4 MG disintegrating tablet, Take 1 tablet (4 mg total) by mouth every 8 (eight) hours as needed for nausea or vomiting., Disp: 20 tablet, Rfl: 0   oxyCODONE -acetaminophen  (PERCOCET) 5-325 MG tablet, Take 1 tablet by mouth every 4 (four) hours as needed for severe pain (pain score 7-10)., Disp: 30 tablet, Rfl: 0  Social History   Tobacco Use  Smoking Status Never  Smokeless Tobacco Never    No Known Allergies Objective:  There were no vitals filed for this visit. There is no height or weight on file to calculate BMI. Constitutional Well developed. Well nourished.  Vascular Foot warm and well perfused. Capillary refill normal to all digits.   Neurologic Normal speech. Oriented to person, place, and time. Epicritic sensation to light  touch grossly present bilaterally.  Dermatologic  skin completely epithelialized no signs of dehiscence noted no complication noted.  Relax plantar fascial band noted no tightness noted good ankle range of motion noted  Orthopedic: Mild tenderness to palpation noted about the surgical site.   Radiographs: None Assessment:   1. Gastrocnemius equinus, left   2. Plantar fasciitis of left foot   3. Status post foot surgery     Plan:  Patient was evaluated and treated and all questions answered.  S/p foot surgery left - Clinically healed well doing well.  All the sutures were removed no complication noted she will begin transition out of boot into regular shoes.  I will see her back in 4 weeks for final follow-up if any foot and ankle issues around future she will come back and see me. No follow-ups on file.

## 2024-08-06 ENCOUNTER — Encounter: Payer: Self-pay | Admitting: Podiatry

## 2024-08-14 ENCOUNTER — Ambulatory Visit: Admitting: Podiatry

## 2024-08-14 DIAGNOSIS — M21961 Unspecified acquired deformity of right lower leg: Secondary | ICD-10-CM | POA: Diagnosis not present

## 2024-08-14 DIAGNOSIS — M21962 Unspecified acquired deformity of left lower leg: Secondary | ICD-10-CM | POA: Diagnosis not present

## 2024-08-14 NOTE — Progress Notes (Signed)
 Orthotics   Patient was present and evaluated for Custom molded foot orthotics. Patient will benefit from CFO's to provide total contact to BIL MLA's helping to balance and distribute body weight more evenly across BIL feet helping to reduce plantar pressure and pain. Orthotic will also encourage FF / RF alignment  Patient was scanned today and will return for fitting upon receipt  $7,500 ded is met no coins for DME / in-network  [facility] is active   Sarah Sanchez,

## 2024-08-14 NOTE — Progress Notes (Signed)
  Subjective:  Patient ID: Sarah Sanchez, female    DOB: Feb 26, 1981,  MRN: [id number]  Chief Complaint  Patient presents with   Routine Post Op    Pt stated that she is doing well she has no concerns at this time     DOS: 06/24/2024 Procedure: Left EPF and left gastrocnemius recession  43 y.o. female returns for post-op check.  She states that she is doing well.  Denies any other acute complaints. To get no further pain.  Denies any other acute issues  Review of Systems: Negative except as noted in the HPI. Denies N/V/F/Ch.  No past medical history on file.  Current Outpatient Medications:    celecoxib (CELEBREX) 200 MG capsule, Take 200 mg by mouth daily., Disp: , Rfl:    diclofenac  (VOLTAREN ) 75 MG EC tablet, Take 1 tablet (75 mg total) by mouth 2 (two) times daily., Disp: 30 tablet, Rfl: 0   gabapentin (NEURONTIN) 300 MG capsule, Take 300 mg by mouth daily., Disp: , Rfl:    ibuprofen  (ADVIL ) 800 MG tablet, Take 1 tablet (800 mg total) by mouth every 6 (six) hours as needed., Disp: 60 tablet, Rfl: 1   ondansetron  (ZOFRAN  ODT) 4 MG disintegrating tablet, Take 1 tablet (4 mg total) by mouth every 8 (eight) hours as needed for nausea or vomiting., Disp: 20 tablet, Rfl: 0   oxyCODONE -acetaminophen  (PERCOCET) 5-325 MG tablet, Take 1 tablet by mouth every 4 (four) hours as needed for severe pain (pain score 7-10)., Disp: 30 tablet, Rfl: 0  Social History   Tobacco Use  Smoking Status Never  Smokeless Tobacco Never    No Known Allergies Objective:  There were no vitals filed for this visit. There is no height or weight on file to calculate BMI. Constitutional Well developed. Well nourished.  Vascular Foot warm and well perfused. Capillary refill normal to all digits.   Neurologic Normal speech. Oriented to person, place, and time. Epicritic sensation to light touch grossly present bilaterally.  Dermatologic  skin completely epithelialized no signs of dehiscence noted no  complication noted.  Relax plantar fascial band noted no tightness noted good ankle range of motion noted  Orthopedic: Mild tenderness to palpation noted about the surgical site.   Radiographs: None Assessment:   1. Foot deformity, bilateral      Plan:  Patient was evaluated and treated and all questions answered.  S/p foot surgery left - Clinically healed and officially discharged from my care if any foot and ankle issues are future come back and see me.  At this time she wants Plavix.  I discussed this with her she states understanding like to proceed with a  Pes planovalgus/foot deformity -I explained to patient the etiology of pes planovalgus and relationship with heel pain/arch pain and various treatment options were discussed.  Given patient foot structure in the setting of heel pain/arch pain I believe patient will benefit from custom-made orthotics to help control the hindfoot motion support the arch of the foot and take the stress away from arches.  Patient agrees with the plan like to proceed with orthotics -Patient was casted for orthotics  No follow-ups on file.

## 2024-08-15 ENCOUNTER — Encounter: Payer: Self-pay | Admitting: Podiatry

## 2024-08-27 ENCOUNTER — Telehealth: Payer: Self-pay

## 2024-08-27 NOTE — Telephone Encounter (Signed)
 Called pt to schedule orthotic pu/ fitting call got disconnected

## 2024-08-27 NOTE — Telephone Encounter (Signed)
 Orthotics are here Nurse, adult form signed and on file Appt needed

## 2024-11-04 ENCOUNTER — Telehealth: Payer: Self-pay

## 2024-11-04 NOTE — Telephone Encounter (Signed)
 Tried to reach out to the patient to let her know her CFO are in and she call back to schedule to PUO
